# Patient Record
Sex: Female | Born: 1970 | Race: Black or African American | Hispanic: No | Marital: Single | Smoking: Never smoker
Health system: Southern US, Community
[De-identification: ages and names within clinical notes are randomized; demographics above are authoritative.]

## PROBLEM LIST (undated history)

## (undated) DIAGNOSIS — J45909 Unspecified asthma, uncomplicated: Secondary | ICD-10-CM

## (undated) DIAGNOSIS — T7840XA Allergy, unspecified, initial encounter: Secondary | ICD-10-CM

## (undated) DIAGNOSIS — I10 Essential (primary) hypertension: Secondary | ICD-10-CM

## (undated) HISTORY — PX: UMBILICAL HERNIA REPAIR: SHX196

## (undated) HISTORY — DX: Unspecified asthma, uncomplicated: J45.909

## (undated) HISTORY — PX: PARTIAL HYSTERECTOMY: SHX80

## (undated) HISTORY — DX: Allergy, unspecified, initial encounter: T78.40XA

---

## 2019-01-21 ENCOUNTER — Other Ambulatory Visit: Payer: Self-pay | Admitting: Family Medicine

## 2019-01-21 ENCOUNTER — Other Ambulatory Visit (HOSPITAL_COMMUNITY): Payer: Self-pay | Admitting: Family Medicine

## 2019-01-21 DIAGNOSIS — M5413 Radiculopathy, cervicothoracic region: Secondary | ICD-10-CM

## 2019-02-02 ENCOUNTER — Other Ambulatory Visit: Payer: Self-pay

## 2019-02-02 ENCOUNTER — Encounter: Payer: Self-pay | Admitting: Sports Medicine

## 2019-02-02 ENCOUNTER — Ambulatory Visit: Payer: Medicaid Other | Admitting: Sports Medicine

## 2019-02-02 DIAGNOSIS — L84 Corns and callosities: Secondary | ICD-10-CM

## 2019-02-02 DIAGNOSIS — B351 Tinea unguium: Secondary | ICD-10-CM | POA: Diagnosis not present

## 2019-02-02 DIAGNOSIS — M79672 Pain in left foot: Secondary | ICD-10-CM

## 2019-02-02 DIAGNOSIS — M79671 Pain in right foot: Secondary | ICD-10-CM

## 2019-02-02 NOTE — Progress Notes (Signed)
Subjective: Gina Mendez is a 49 y.o. female patient seen today in office with complaint of mildly painful thickened and discolored nails. Patient is desiring treatment for nail changes; has tried OTC topicals/Medication in the past with no improvement. Reports that nails are becoming difficult to manage because of the thickness.  Admits a history of going to the nail salon and then putting acrylic on her toenails and afterwards the nails thickening up and breaking off.  Patient also complains of hard callus skin to the bottoms of both feet in the past have been cut out with there was a big hole in the area skin grew back but it grew back callus with no improvement.  Patient has no other pedal complaints at this time.    Review of Systems  All other systems reviewed and are negative.   There are no problems to display for this patient.   No current outpatient medications on file prior to visit.   No current facility-administered medications on file prior to visit.    Allergies  Allergen Reactions  . Almond Oil Anaphylaxis  . No Known Allergies Anaphylaxis  . Other Anaphylaxis  . Oxycodone-Acetaminophen Shortness Of Breath  . Ceftriaxone     States she gets a yeast infection after taking.  . Promethazine     Objective: Physical Exam  General: Well developed, nourished, no acute distress, awake, alert and oriented x 3  Vascular: Dorsalis pedis artery 2/4 bilateral, Posterior tibial artery 2/4 bilateral, skin temperature warm to warm proximal to distal bilateral lower extremities, no varicosities, pedal hair present bilateral.  Neurological: Gross sensation present via light touch bilateral.   Dermatological: Skin is warm, dry, and supple bilateral, Nails 1-10 are tender, short thick, and discolored with mild subungal debris with bilateral first and second toes most involved, no webspace macerations present bilateral, no open lesions present bilateral, + callus/hyperkeratotic  tissue present bilateral fifth metatarsophalangeal joint and second metatarsophalangeal joints plantar aspect. No signs of infection bilateral.  Musculoskeletal: Fat pad atrophy and prominent metatarsal boney deformities noted bilateral. Muscular strength within normal limits without painon range of motion. No pain with calf compression bilateral.  Assessment and Plan:  Problem List Items Addressed This Visit    None    Visit Diagnoses    Nail fungus    -  Primary   Relevant Orders   Culture, fungus without smear   Callus       Foot pain, bilateral          -Examined patient -Discussed treatment options for painful dystrophic nails and callus -Fungal culture was obtained by removing a portion of the hard nail itself from each of the involved toenails using a sterile nail nipper and sent to [facility] lab. Patient tolerated the biopsy procedure well without discomfort or need for anesthesia.  -At no additional charge mechanically debrided callus x3 plantar surfaces of right foot using a sterile chisel blade without incident -Advised patient that she may benefit from custom orthotics or over-the-counter to help offload the calluses bilateral -Advised patient to use foot miracle cream as dispensed to callus areas sample dispensed -Patient to return in 4 weeks for follow up evaluation and discussion of fungal culture results or sooner if symptoms worsen.  Asencion Islam, DPM

## 2019-02-08 ENCOUNTER — Encounter (HOSPITAL_COMMUNITY): Payer: Self-pay

## 2019-02-08 ENCOUNTER — Ambulatory Visit (HOSPITAL_COMMUNITY): Payer: Medicaid Other

## 2019-03-02 ENCOUNTER — Other Ambulatory Visit: Payer: Self-pay | Admitting: Sports Medicine

## 2019-03-02 ENCOUNTER — Ambulatory Visit: Payer: Medicaid Other | Admitting: Sports Medicine

## 2019-03-02 ENCOUNTER — Encounter: Payer: Self-pay | Admitting: Sports Medicine

## 2019-03-02 ENCOUNTER — Ambulatory Visit (INDEPENDENT_AMBULATORY_CARE_PROVIDER_SITE_OTHER): Payer: Medicaid Other

## 2019-03-02 ENCOUNTER — Other Ambulatory Visit: Payer: Self-pay

## 2019-03-02 DIAGNOSIS — S96891A Other specified injury of other specified muscles and tendons at ankle and foot level, right foot, initial encounter: Secondary | ICD-10-CM

## 2019-03-02 DIAGNOSIS — M25571 Pain in right ankle and joints of right foot: Secondary | ICD-10-CM

## 2019-03-02 DIAGNOSIS — M79671 Pain in right foot: Secondary | ICD-10-CM

## 2019-03-02 DIAGNOSIS — S96811A Strain of other specified muscles and tendons at ankle and foot level, right foot, initial encounter: Secondary | ICD-10-CM | POA: Diagnosis not present

## 2019-03-02 DIAGNOSIS — S96911A Strain of unspecified muscle and tendon at ankle and foot level, right foot, initial encounter: Secondary | ICD-10-CM

## 2019-03-02 DIAGNOSIS — L603 Nail dystrophy: Secondary | ICD-10-CM

## 2019-03-02 MED ORDER — MELOXICAM 15 MG PO TABS: 15.0000 mg | ORAL_TABLET | Freq: Every day | ORAL | 0 refills | Status: DC

## 2019-03-02 NOTE — Progress Notes (Signed)
Subjective:  Gina Mendez is a 49 y.o. female patient who presents to office for evaluation of right ankle pain. Patient complains of continued pain in the ankle last 6 months. Patient has tried compression with no relief in symptoms.  Reports that she also went to a dermatologist who biopsied the dark skin lesion to the area and admitted that there was scar tissue etc. there was nothing more to do but the area continue to dark skin and there are more dark areas like this on the right ankle.  Patient denies any other pedal complaints. Denies injury/trip/fall/sprain/any causative factors.   Patient is also here for discussion of fungal culture results.    There are no problems to display for this patient.   No current outpatient medications on file prior to visit.   No current facility-administered medications on file prior to visit.    Allergies  Allergen Reactions  . Almond Oil Anaphylaxis  . No Known Allergies Anaphylaxis  . Other Anaphylaxis  . Oxycodone-Acetaminophen Shortness Of Breath  . Ceftriaxone     States she gets a yeast infection after taking.  . Promethazine     Objective:  General: Alert and oriented x3 in no acute distress  Dermatology: Nails are short and discolored especially bilateral first and second toenails with subungual debris and dark hyperpigmentation to the nails.  There is hyperpigmented flat lesions consistent with possible nevus versus scar at right ankle that are currently asymptomatic.  Vascular: Dorsalis Pedis and Posterior Tibial pedal pulses palpable, Capillary Fill Time 3 seconds,(+) pedal hair growth bilateral, no edema bilateral lower extremities, Temperature gradient within normal limits.  Neurology: Michaell Cowing sensation intact via light touch bilateral.  Musculoskeletal: Mild tenderness with palpation at right ankle at peroneal tendon course with some mild swelling. Negative talar tilt, Negative tib-fib stress, No instability. No pain with  calf compression bilateral. Range of motion within normal limits with mild guarding on right ankle. Strength within normal limits in all groups bilateral.   Gait: Antalgic gait  Xrays  Right foot/ankle   Impression: No acute osseous findings  Assessment and Plan: Problem List Items Addressed This Visit    None    Visit Diagnoses    Pain in right foot    -  Primary   Relevant Orders   DG Foot Complete Right   Right ankle pain, unspecified chronicity       Relevant Medications   meloxicam (MOBIC) 15 MG tablet   Nail dystrophy       Tear of tendon of right ankle, initial encounter           -Complete examination performed -Xrays reviewed -Discussed treatment options for tendinitis versus tear at right lateral ankle since pain has been going on greater than 6 months without improvement -Prescribed meloxicam -Ordered MRI for further evaluation of peroneal tendon rule out tear -Advised good supportive shoes and avoid activities that could aggravate the area -Continue with ankle support and avoid socks that rub or irritate ankle that could aggravate darkened/hyperpigmented areas -Rx Tolcylen gel for patient to use on nails and to wear shoes that are comfortable to prevent issues with microtrauma -Patient to return to office as scheduled or sooner if condition worsens.  Asencion Islam, DPM

## 2019-03-08 ENCOUNTER — Telehealth: Payer: Self-pay | Admitting: *Deleted

## 2019-03-08 DIAGNOSIS — S96911A Strain of unspecified muscle and tendon at ankle and foot level, right foot, initial encounter: Secondary | ICD-10-CM

## 2019-03-08 DIAGNOSIS — M25571 Pain in right ankle and joints of right foot: Secondary | ICD-10-CM

## 2019-03-08 NOTE — Telephone Encounter (Signed)
-----   Message from Asencion Islam, [place] sent at 03/02/2019  9:50 AM EST ----- Regarding: MRI R ankle Chronic pain and swelling >6 months at lateral ankle not improved with compression sleeve, rest, ice, elevation

## 2019-03-23 NOTE — Telephone Encounter (Signed)
[employer] - Medicaid requires clinicals for review prior to authorizing MRI RIGHT ANKLE 73721 without contrast, CASE: [id number]. Faxed clinicals to [employer].

## 2019-03-30 NOTE — Telephone Encounter (Addendum)
[employer] - MEDICAID AUTHORIZATION: [id number], FOR [id number] MRI RIGHT ANKLE, EFFECTIVE:  03/23/2019, END:  09/19/2019. Faxed to [facility] with orders.

## 2019-04-07 ENCOUNTER — Ambulatory Visit
Admission: RE | Admit: 2019-04-07 | Discharge: 2019-04-07 | Disposition: A | Discharge status: Home / Self Care | Payer: Medicaid Other | Source: Ambulatory Visit | Attending: Sports Medicine | Admitting: Sports Medicine

## 2019-04-07 ENCOUNTER — Other Ambulatory Visit: Payer: Self-pay

## 2019-04-07 DIAGNOSIS — M25571 Pain in right ankle and joints of right foot: Secondary | ICD-10-CM

## 2019-04-07 DIAGNOSIS — S96911A Strain of unspecified muscle and tendon at ankle and foot level, right foot, initial encounter: Secondary | ICD-10-CM

## 2019-04-13 ENCOUNTER — Telehealth: Payer: Self-pay | Admitting: Sports Medicine

## 2019-04-13 DIAGNOSIS — M25571 Pain in right ankle and joints of right foot: Secondary | ICD-10-CM

## 2019-04-13 NOTE — Telephone Encounter (Signed)
Patient said she received the results for her MRI, in My Chart and does not understand it. She was wondering if you could give her a call, to discuss it with her. She is scheduled to come in on 3/30, but still having pain, would like a update.

## 2019-04-13 NOTE — Telephone Encounter (Signed)
Spoke with patient Gina Mendez please order and arthritic panel Patient will stop by office to pick up requistion and go to lab to get blood work

## 2019-04-13 NOTE — Addendum Note (Signed)
Addended by: Alphia Kava D on: 04/13/2019 01:34 PM   Modules accepted: Orders

## 2019-04-15 ENCOUNTER — Telehealth: Payer: Self-pay | Admitting: Sports Medicine

## 2019-04-15 ENCOUNTER — Encounter: Payer: Self-pay | Admitting: Sports Medicine

## 2019-04-15 LAB — CBC WITH DIFFERENTIAL/PLATELET
Absolute Monocytes: 363 cells/uL (ref 200–950)
Basophils Absolute: 41 cells/uL (ref 0–200)
Basophils Relative: 1.1 %
Eosinophils Absolute: 122 cells/uL (ref 15–500)
Eosinophils Relative: 3.3 %
HCT: 36.8 % (ref 35.0–45.0)
Hemoglobin: 12.2 g/dL (ref 11.7–15.5)
Lymphs Abs: 2024 cells/uL (ref 850–3900)
MCH: 27.2 pg (ref 27.0–33.0)
MCHC: 33.2 g/dL (ref 32.0–36.0)
MCV: 82 fL (ref 80.0–100.0)
MPV: 8.7 fL (ref 7.5–12.5)
Monocytes Relative: 9.8 %
Neutro Abs: 1151 cells/uL — ABNORMAL LOW (ref 1500–7800)
Neutrophils Relative %: 31.1 %
Platelets: 401 10*3/uL — ABNORMAL HIGH (ref 140–400)
RBC: 4.49 10*6/uL (ref 3.80–5.10)
RDW: 12.2 % (ref 11.0–15.0)
Total Lymphocyte: 54.7 %
WBC: 3.7 10*3/uL — ABNORMAL LOW (ref 3.8–10.8)

## 2019-04-15 LAB — RHEUMATOID FACTOR: Rheumatoid fact SerPl-aCnc: <14 IU/mL (ref <14)

## 2019-04-15 LAB — C-REACTIVE PROTEIN: CRP: 3.7 mg/L (ref <8.0)

## 2019-04-15 LAB — ANA: Anti Nuclear Antibody (ANA): NEGATIVE

## 2019-04-15 LAB — URIC ACID: Uric Acid, Serum: 4.8 mg/dL (ref 2.5–7.0)

## 2019-04-15 LAB — SEDIMENTATION RATE: Sed Rate: 28 mm/h — ABNORMAL HIGH (ref 0–20)

## 2019-04-15 NOTE — Telephone Encounter (Signed)
Pt called wanted to know if Dr.Stover had read her blood results pt just wanted to understand her blood work.

## 2019-04-16 ENCOUNTER — Telehealth: Payer: Self-pay | Admitting: *Deleted

## 2019-04-16 MED ORDER — DICLOFENAC SODIUM 75 MG PO TBEC: 75.0000 mg | DELAYED_RELEASE_TABLET | Freq: Two times a day (BID) | ORAL | 1 refills | Status: DC

## 2019-04-16 NOTE — Telephone Encounter (Signed)
-----   Message from Gina Mendez, [place] sent at 04/15/2019  7:17 PM EDT ----- Please let patient know that her inflammation markers are elevated likely due to her tendonitis. There are no signs of any type of arthritis.If she is still having pain stop Mobic and we will send Diclofenac 75mg  bid to her pharmacy to try instead to see if this gives her better relief.

## 2019-04-16 NOTE — Telephone Encounter (Signed)
Pt informed in Results Notes.

## 2019-04-16 NOTE — Telephone Encounter (Signed)
I spoke with pt and informed of Dr. Wynema Birch review of results and orders. Pt states she is not getting pain relief and still has swelling, brace did not help either. I told pt Dr. Marylene Land had wanted her to stop the Mobic in that case, and we would send in Diclofenac and she would need to take with a snack at each dose.

## 2019-04-27 ENCOUNTER — Ambulatory Visit: Payer: Medicaid Other | Admitting: Sports Medicine

## 2019-04-27 ENCOUNTER — Other Ambulatory Visit: Payer: Self-pay

## 2019-04-27 ENCOUNTER — Encounter: Payer: Self-pay | Admitting: Sports Medicine

## 2019-04-27 VITALS — Temp 97.8°F

## 2019-04-27 DIAGNOSIS — M7751 Other enthesopathy of right foot: Secondary | ICD-10-CM | POA: Diagnosis not present

## 2019-04-27 DIAGNOSIS — M25571 Pain in right ankle and joints of right foot: Secondary | ICD-10-CM

## 2019-04-27 DIAGNOSIS — M79671 Pain in right foot: Secondary | ICD-10-CM

## 2019-04-27 DIAGNOSIS — M779 Enthesopathy, unspecified: Secondary | ICD-10-CM

## 2019-04-27 MED ORDER — PREDNISONE 10 MG (21) PO TBPK: ORAL_TABLET | ORAL | 0 refills | Status: DC

## 2019-04-27 MED ORDER — TRIAMCINOLONE ACETONIDE 10 MG/ML IJ SUSP
10.0000 mg | Freq: Once | INTRAMUSCULAR | Status: AC
  Administered 2019-04-27: 10 mg

## 2019-04-27 NOTE — Progress Notes (Signed)
Subjective:  Gina Mendez is a 49 y.o. female patient who returns to office for evaluation of right ankle pain. Patient complains of continued pain in the foot and ankle reports that her Tylenol and Motrin combination seems to help some she could not take Mobic because of GI problems but does report that she hears some popping especially in her knee when walking.  Patient is also here for review of MRI results.  Patient denies any other pedal complaints.  No other issues noted.    There are no problems to display for this patient.   Current Outpatient Medications on File Prior to Visit  Medication Sig Dispense Refill  . diclofenac (VOLTAREN) 75 MG EC tablet Take 1 tablet (75 mg total) by mouth 2 (two) times daily. 30 tablet 1  . meloxicam (MOBIC) 15 MG tablet Take 1 tablet (15 mg total) by mouth daily. (Patient not taking: Reported on 04/27/2019) 30 tablet 0   No current facility-administered medications on file prior to visit.    Allergies  Allergen Reactions  . Almond Oil Anaphylaxis  . No Known Allergies Anaphylaxis  . Other Anaphylaxis  . Oxycodone-Acetaminophen Shortness Of Breath  . Ceftriaxone     States she gets a yeast infection after taking.  . Promethazine     Objective:  General: Alert and oriented x3 in no acute distress  Dermatology: Nails are short and discolored especially bilateral first and second toenails with subungual debris and dark hyperpigmentation to the nails.  There is hyperpigmented flat lesions consistent with possible nevus versus scar at right ankle that are currently asymptomatic.  Vascular: Dorsalis Pedis and Posterior Tibial pedal pulses palpable, Capillary Fill Time 3 seconds,(+) pedal hair growth bilateral, no edema bilateral lower extremities, Temperature gradient within normal limits.  Neurology: Michaell Cowing sensation intact via light touch bilateral.  Musculoskeletal: Mild tenderness with palpation at right ankle at peroneal tendon course  with some mild swelling with also some pain and other tender areas along the sinus tarsi and mild pain at the plantar heel and Achilles tendon. Negative talar tilt, Negative tib-fib stress, No instability. No pain with calf compression bilateral. Range of motion within normal limits with mild guarding on right ankle. Strength within normal limits in all groups bilateral.   Gait: Antalgic gait  MRI IMPRESSION: 1. Mild posterior tibialis tenosynovitis. 2. Achilles insertional tendinosis with trace retrocalcaneal bursitis. 3. Minimal middle cord plantar fasciitis. 4. Edema within the sinus tarsi with a small amount of subtalar joint fluid. This could be due to sinus tarsi syndrome. Please correlate with the patient's site of pain. 5. Heel pad inflammation.  Assessment and Plan:   Problem List Items Addressed This Visit    None    Visit Diagnoses    Right ankle pain, unspecified chronicity    -  Primary   Pain in right foot       Tendonitis       Sinus tarsi syndrome of right foot           -Complete examination performed -Blood work consistent with inflammation nonspecific with mildly elevated sed rate -MRI results reviewed with patient -After oral consent and aseptic prep, injected a mixture containing 1 ml of 2%  plain lidocaine, 1 ml 0.5% plain marcaine, 0.5 ml of kenalog 10 and 0.5 ml of dexamethasone phosphate into right lateral foot and ankle along the peroneal tendon course as well as tried to disperse the medication to the lateral ankle at the level of the sinus tarsi without  complication. Post-injection care discussed with patient.  -Prescribed prednisone Dosepak for patient to take as instructed -May continue with Tylenol as needed for additional breakthrough pain -Advise rest ice elevation as needed -Advised patient to get cam boot off [place] -Patient to return to office in 3 to 4 weeks for follow-up evaluation of right foot and ankle pain as scheduled or sooner if  condition worsens.  Landis Martins, DPM

## 2019-05-18 ENCOUNTER — Ambulatory Visit: Payer: Medicaid Other | Admitting: Sports Medicine

## 2019-05-18 ENCOUNTER — Other Ambulatory Visit: Payer: Self-pay

## 2019-05-18 ENCOUNTER — Encounter: Payer: Self-pay | Admitting: Sports Medicine

## 2019-05-18 VITALS — Temp 97.6°F

## 2019-05-18 DIAGNOSIS — M779 Enthesopathy, unspecified: Secondary | ICD-10-CM

## 2019-05-18 DIAGNOSIS — M79671 Pain in right foot: Secondary | ICD-10-CM

## 2019-05-18 DIAGNOSIS — M25571 Pain in right ankle and joints of right foot: Secondary | ICD-10-CM

## 2019-05-18 DIAGNOSIS — M7751 Other enthesopathy of right foot: Secondary | ICD-10-CM | POA: Diagnosis not present

## 2019-05-18 MED ORDER — TRIAMCINOLONE ACETONIDE 10 MG/ML IJ SUSP: 10.0000 mg | Freq: Once | INTRAMUSCULAR | Status: DC

## 2019-05-18 NOTE — Progress Notes (Signed)
  Subjective: Omaya Nieland is a 49 y.o. female patient who returns to office for evaluation of right ankle pain. Patient reports that pain is still intense but not as bad as last time with pain all over but worse at the back of the heel at the achilles with some swelling still. Pain even with the boot. Reports that she completed her Medrol dose pack.  Patient denies any other pedal complaints.  No other issues noted.  There are no problems to display for this patient.   Current Outpatient Medications on File Prior to Visit  Medication Sig Dispense Refill  . diclofenac (VOLTAREN) 75 MG EC tablet Take 1 tablet (75 mg total) by mouth 2 (two) times daily. 30 tablet 1   No current facility-administered medications on file prior to visit.    Allergies  Allergen Reactions  . Almond Oil Anaphylaxis  . No Known Allergies Anaphylaxis  . Other Anaphylaxis  . Oxycodone-Acetaminophen Shortness Of Breath  . Ceftriaxone     States she gets a yeast infection after taking.  . Promethazine     Objective:  General: Alert and oriented x3 in no acute distress  Dermatology: Nails are short and discolored especially bilateral first and second toenails with subungual debris and dark hyperpigmentation to the nails.  There is hyperpigmented flat lesions consistent with possible nevus versus scar at right ankle that are currently asymptomatic.  Vascular: Dorsalis Pedis and Posterior Tibial pedal pulses palpable, Capillary Fill Time 3 seconds,(+) pedal hair growth bilateral, no edema bilateral lower extremities, Temperature gradient within normal limits.  Neurology: Michaell Cowing sensation intact via light touch bilateral.  Musculoskeletal: Mild tenderness with palpation at right ankle at peroneal tendon course with some mild swelling with also some pain and other tender areas along the sinus tarsi and mild pain at the plantar heel and Achilles tendon with the lateral achilles tendon area being worse at today's  visit. Range of motion within normal limits with mild guarding on right ankle. Strength within normal limits in all groups bilateral.   Gait: Antalgic CAM boot assisted gait  Assessment and Plan:   Problem List Items Addressed This Visit    None    Visit Diagnoses    Sinus tarsi syndrome of right foot    -  Primary   Tendonitis       Right ankle pain, unspecified chronicity       Pain in right foot       Bursitis of right ankle         -Complete examination performed -Re-discussed extensive inflammation on right foot and ankle -After oral consent and aseptic prep, injected a mixture containing 1 ml of 2%  plain lidocaine, 1 ml 0.5% plain marcaine, 0.5 ml of kenalog 10 and 0.5 ml of dexamethasone phosphate into right heel along the lateral achilles tendon course as with care. Post-injection care discussed with patient.  -Dispensed surgitube compression sleeve for right foot and ankle swelling -Continue with CAM boot after 1 week may try tennis shoe with heel lift if area feels better -May continue with Tylenol as needed -Continue with rest, ice, elevation  -Patient to return to office 2-3 weeks for follow-up evaluation of right foot and ankle pain as scheduled or sooner if condition worsens.  Asencion Islam, DPM

## 2019-05-28 ENCOUNTER — Other Ambulatory Visit: Payer: Self-pay

## 2019-05-28 ENCOUNTER — Ambulatory Visit (HOSPITAL_COMMUNITY)
Admission: EM | Admit: 2019-05-28 | Discharge: 2019-05-28 | Disposition: A | Discharge status: Home / Self Care | Payer: Medicaid Other | Attending: Family Medicine | Admitting: Family Medicine

## 2019-05-28 ENCOUNTER — Ambulatory Visit (INDEPENDENT_AMBULATORY_CARE_PROVIDER_SITE_OTHER): Payer: Medicaid Other

## 2019-05-28 ENCOUNTER — Encounter (HOSPITAL_COMMUNITY): Payer: Self-pay

## 2019-05-28 DIAGNOSIS — M79601 Pain in right arm: Secondary | ICD-10-CM | POA: Diagnosis not present

## 2019-05-28 DIAGNOSIS — R002 Palpitations: Secondary | ICD-10-CM

## 2019-05-28 DIAGNOSIS — R0789 Other chest pain: Secondary | ICD-10-CM

## 2019-05-28 MED ORDER — DICLOFENAC SODIUM 50 MG PO TBEC: 50.0000 mg | DELAYED_RELEASE_TABLET | Freq: Two times a day (BID) | ORAL | 0 refills | Status: DC

## 2019-05-28 NOTE — Discharge Instructions (Addendum)
EKG normal- Please follow up with cardiology for further evaluation of symptoms Try diclofenac twice daily for arm/chest and ankle pain- take with food twice daily Follow up with cardiology to have updated stress test, possible echo or monitoring of your heart fluttering sensation Reestablish care with primary care- contact below

## 2019-05-28 NOTE — ED Provider Notes (Signed)
[facility]    CSN: [id number] Arrival date & time: 05/28/19  1002      History   Chief Complaint Chief Complaint  Patient presents with  . Chest Pain  . Arm Pain  . Palpitations    HPI Gina Mendez is a 49 y.o. female presenting today for evaluation of multiple complaints including right arm pain, chest pain, palpitations.  Patient notes that over the past year she has had intermittent right arm pain and swelling that extends from her right neck/back area and goes into her hand.  She denies any injury.  Does note that she is right-handed and works for a [employer] and is often using this hand for work.  She also notes that she has a remote injury of sustaining glass within her arm which is still present.  This was not removed as concern for proximity to an artery.  She is concerned of this may be contributing to her recurrent pain.  Feels pain is interfering with her day-to-day job.  Of recently she has also developed more discomfort in her chest.  This is central.  Feels a dull aching sensation.  She also reports sensations of palpitations/heart racing and heart fluttering especially at nighttime.  These episodes are scaring her and have been affecting her sleep.  States symptoms resolved by morning time.  She is concerned as her sister has had a history of CHF.  She also reports having prior echo of heart showing decreased ejection fraction, but this was at a different hospital.  She recently moved here.  She also reports increased shortness of breath is along with needing to sleep on pillows with sleep.  She also reports chronic issues with her right ankle/foot and is seeing child foot and ankle for this.  She recently was on a course of prednisone which did significantly help her symptoms in her foot as well as her arm, but symptoms have returned since being off the prednisone.  She was trying Mobic without relief of symptoms.  HPI  History reviewed. No pertinent  past medical history.  There are no problems to display for this patient.   History reviewed. No pertinent surgical history.  OB History   No obstetric history on file.      Home Medications    Prior to Admission medications   Medication Sig Start Date End Date Taking? Authorizing Provider  diclofenac (VOLTAREN) 50 MG EC tablet Take 1 tablet (50 mg total) by mouth 2 (two) times daily. 05/28/19   Leonel Mccollum, Junius Creamer, PA-C    Family History History reviewed. No pertinent family history.  Social History Social History   Tobacco Use  . Smoking status: Never Smoker  . Smokeless tobacco: Never Used  Substance Use Topics  . Alcohol use: Not on file  . Drug use: Not on file     Allergies   Almond oil, Oxycodone-acetaminophen, and Promethazine   Review of Systems Review of Systems  Constitutional: Negative for fatigue and fever.  HENT: Negative for congestion, sinus pressure and sore throat.   Eyes: Negative for photophobia, pain and visual disturbance.  Respiratory: Negative for cough and shortness of breath.   Cardiovascular: Positive for chest pain and palpitations. Negative for leg swelling.  Gastrointestinal: Negative for abdominal pain, nausea and vomiting.  Genitourinary: Negative for decreased urine volume and hematuria.  Musculoskeletal: Positive for arthralgias, back pain, myalgias, neck pain and neck stiffness.  Neurological: Positive for headaches. Negative for dizziness, syncope, facial asymmetry, speech difficulty,  weakness, light-headedness and numbness.     Physical Exam Triage Vital Signs ED Triage Vitals  Enc Vitals Group     BP 05/28/19 1035 140/84     Pulse Rate 05/28/19 1035 88     Resp 05/28/19 1035 20     Temp 05/28/19 1035 99.2 F (37.3 C)     Temp Source 05/28/19 1035 Oral     SpO2 05/28/19 1035 98 %     Weight --      Height --      Head Circumference --      Peak Flow --      Pain Score 05/28/19 1031 7     Pain Loc --      Pain Edu?  --      Excl. in [place]? --    No data found.  Updated Vital Signs BP 140/84 (BP Location: Left Arm)   Pulse 88   Temp 99.2 F (37.3 C) (Oral)   Resp 20   SpO2 98%   Visual Acuity Right Eye Distance:   Left Eye Distance:   Bilateral Distance:    Right Eye Near:   Left Eye Near:    Bilateral Near:     Physical Exam Vitals and nursing note reviewed.  Constitutional:      General: She is not in acute distress.    Appearance: She is well-developed.  HENT:     Head: Normocephalic and atraumatic.     Mouth/Throat:     Comments: Oral mucosa pink and moist, no tonsillar enlargement or exudate. Posterior pharynx patent and nonerythematous, no uvula deviation or swelling. Normal phonation. Eyes:     Extraocular Movements: Extraocular movements intact.     Conjunctiva/sclera: Conjunctivae normal.     Pupils: Pupils are equal, round, and reactive to light.  Cardiovascular:     Rate and Rhythm: Normal rate and regular rhythm.     Heart sounds: No murmur.     Comments: No carotid bruits auscultated Pulmonary:     Effort: Pulmonary effort is normal. No respiratory distress.     Breath sounds: Normal breath sounds.     Comments: Breathing comfortably at rest, CTABL, no wheezing, rales or other adventitious sounds auscultated  Left parasternal border with mildly reproducible tenderness to palpation Abdominal:     Palpations: Abdomen is soft.     Tenderness: There is no abdominal tenderness.  Musculoskeletal:     Cervical back: Neck supple.     Comments: Back: Diffuse tenderness throughout entire cervical, thoracic and lumbar spine midline, diffuse tenderness throughout right cervical and upper thoracic musculature,    Right shoulder: Nontender to palpation along clavicle, AC joint or scapular spine; full active range of motion, strength at shoulder 5/5 and equal bilaterally  Right elbow: Full active range of motion at elbow, nontender to palpation over olecranon and medial and  lateral epicondyles; just distal to the elbow palpable hard foreign body within soft tissue of the proximal forearm  Right hand: No obvious swelling or deformity, radial pulse 2+, full active range of motion of all fingers and wrist, nontender to palpation throughout distal radius and ulna extending into carpals and metacarpals  Skin:    General: Skin is warm and dry.  Neurological:     Mental Status: She is alert.      UC Treatments / Results  Labs (all labs ordered are listed, but only abnormal results are displayed) Labs Reviewed - No data to display  EKG   Radiology DG  Chest 2 View  Result Date: 05/28/2019 CLINICAL DATA:  Chest pain and shortness of breath for 1 year. EXAM: CHEST - 2 VIEW COMPARISON:  None. FINDINGS: The cardiomediastinal silhouette is unremarkable. There is no evidence of focal airspace disease, pulmonary edema, suspicious pulmonary nodule/mass, pleural effusion, or pneumothorax. No acute bony abnormalities are identified. IMPRESSION: No active cardiopulmonary disease. Electronically Signed   By: Harmon Pier M.D.   On: 05/28/2019 11:56    Procedures Procedures (including critical care time)  Medications Ordered in UC Medications - No data to display  Initial Impression / Assessment and Plan / UC Course  I have reviewed the triage vital signs and the nursing notes.  Pertinent labs & imaging results that were available during my care of the patient were reviewed by me and considered in my medical decision making (see chart for details).  Clinical Course as of May 28 1514  Fri May 28, 2019  1231 DG Chest 2 View [YN]    Clinical Course User Index [YN] Eustace Moore, MD    Chest x-ray without acute abnormality, EKG normal sinus rhythm, no acute signs of ischemia or infarction.  Possible MSK cause of chest discomfort.  Also suspect overuse injury contributing to right arm/back pain given nature of job.  Recommending continuing anti-inflammatories  muscle relaxers.  Will provide diclofenac as alternative.  Did discuss possibility of retained foreign body contributing to recurrent nature of her arm pain/swelling.  Advised to follow-up with orthopedics for further discussion of relation/possibility of removal.  Patient new to area, discussed establishing care with PCP for preventative care as well as further evaluation of palpitations with cardiology.  Provided contacts.  Discussed strict return precautions. Patient verbalized understanding and is agreeable with plan.  Final Clinical Impressions(s) / UC Diagnoses   Final diagnoses:  Atypical chest pain  Palpitations  Right arm pain     Discharge Instructions     EKG normal- Please follow up with cardiology for further evaluation of symptoms Try diclofenac twice daily for arm/chest and ankle pain- take with food twice daily Follow up with cardiology to have updated stress test, possible echo or monitoring of your heart fluttering sensation Reestablish care with primary care- contact below   ED Prescriptions    Medication Sig Dispense Auth. Provider   diclofenac (VOLTAREN) 50 MG EC tablet Take 1 tablet (50 mg total) by mouth 2 (two) times daily. 30 tablet Nedim Oki, Golf C, PA-C     PDMP not reviewed this encounter.   Lew Dawes, [place] 05/28/19 1523

## 2019-05-28 NOTE — ED Triage Notes (Addendum)
Pt reports having chest pain and right arm pain and swelling x 1 year. Pt states the chest pain "feels like heartburn". Pt reports having palpitations when she go to sleep x 1 year. Pt states the palpitations wakes her up at night. Pt reports she was seen for this chief complainty by her PCP and everything was good.

## 2019-06-03 ENCOUNTER — Other Ambulatory Visit: Payer: Self-pay | Admitting: Orthopedic Surgery

## 2019-06-03 DIAGNOSIS — M79631 Pain in right forearm: Secondary | ICD-10-CM

## 2019-06-15 ENCOUNTER — Encounter: Payer: Self-pay | Admitting: Sports Medicine

## 2019-06-15 ENCOUNTER — Other Ambulatory Visit: Payer: Self-pay

## 2019-06-15 ENCOUNTER — Ambulatory Visit (INDEPENDENT_AMBULATORY_CARE_PROVIDER_SITE_OTHER): Payer: Medicaid Other | Admitting: Sports Medicine

## 2019-06-15 VITALS — Temp 98.0°F

## 2019-06-15 DIAGNOSIS — M7751 Other enthesopathy of right foot: Secondary | ICD-10-CM

## 2019-06-15 DIAGNOSIS — M779 Enthesopathy, unspecified: Secondary | ICD-10-CM

## 2019-06-15 DIAGNOSIS — M25571 Pain in right ankle and joints of right foot: Secondary | ICD-10-CM

## 2019-06-15 DIAGNOSIS — M79671 Pain in right foot: Secondary | ICD-10-CM

## 2019-06-15 DIAGNOSIS — M25471 Effusion, right ankle: Secondary | ICD-10-CM

## 2019-06-15 NOTE — Progress Notes (Signed)
  Subjective: Gina Mendez is a 49 y.o. female patient who returns to office for follow up evaluation of right ankle/foot pain. Patient reports that pain is doing much better, have swelling still at lateral ankle but elevation has helped. Reports that she feels a little pain with steps and is wondering if she needs to go back to her CAM boot. Patient reports that she has been having back issues and recieved a steroid shot there too and think that it has helped her foot as well.  Patient denies any other pedal complaints. No other issues noted.  There are no problems to display for this patient.   Current Outpatient Medications on File Prior to Visit  Medication Sig Dispense Refill  . diclofenac (VOLTAREN) 50 MG EC tablet Take 1 tablet (50 mg total) by mouth 2 (two) times daily. 30 tablet 0  . Olopatadine HCl 0.2 % SOLN Apply 1 drop to eye every morning.     Current Facility-Administered Medications on File Prior to Visit  Medication Dose Route Frequency Provider Last Rate Last Admin  . triamcinolone acetonide (KENALOG) 10 MG/ML injection 10 mg  10 mg Other Once Asencion Islam, DPM        Allergies  Allergen Reactions  . Almond Oil Anaphylaxis  . Oxycodone-Acetaminophen Shortness Of Breath  . Promethazine     Objective:  General: Alert and oriented x3 in no acute distress  Dermatology: Nails are short and discolored especially bilateral first and second toenails with subungual debris and dark hyperpigmentation to the nails.  There is hyperpigmented flat lesions consistent with possible nevus versus scar at right ankle that are currently asymptomatic.  Vascular: Dorsalis Pedis and Posterior Tibial pedal pulses palpable, Capillary Fill Time 3 seconds,(+) pedal hair growth bilateral, no edema bilateral lower extremities, Temperature gradient within normal limits.  Neurology: Michaell Cowing sensation intact via light touch bilateral.  Musculoskeletal: Minimal tenderness with palpation at  right ankle at peroneal tendon course with some mild swelling, no pain today at sinus tarsi and at the plantar heel. No pain to achilles on right. Range of motion within normal limits with mild guarding on right ankle. Strength within normal limits in all groups bilateral.   Assessment and Plan:   Problem List Items Addressed This Visit    None    Visit Diagnoses    Sinus tarsi syndrome of right foot    -  Primary   Tendonitis       Right ankle pain, unspecified chronicity       Pain in right foot       Swelling of ankle, right         -Complete examination performed -Re-discussed extensive inflammation on right foot and ankle that is improving with still some swelling but decreased pain -Dispensed trilock at no additional charge and advised patient to use this with good supportive tennis shoe instead of CAM boot -May continue with Tylenol as needed -Continue with rest, ice, elevation  -Continue with follow up with back doctor as well  -Patient to return to office if no better after 1 month or sooner if condition worsens.  Asencion Islam, DPM

## 2019-06-20 ENCOUNTER — Ambulatory Visit
Admission: RE | Admit: 2019-06-20 | Discharge: 2019-06-20 | Disposition: A | Discharge status: Home / Self Care | Payer: Medicaid Other | Source: Ambulatory Visit | Attending: Orthopedic Surgery | Admitting: Orthopedic Surgery

## 2019-06-20 DIAGNOSIS — M79631 Pain in right forearm: Secondary | ICD-10-CM

## 2019-07-13 ENCOUNTER — Other Ambulatory Visit: Payer: Self-pay

## 2019-07-13 ENCOUNTER — Ambulatory Visit: Payer: Medicaid Other | Admitting: Sports Medicine

## 2019-07-13 ENCOUNTER — Encounter: Payer: Self-pay | Admitting: Sports Medicine

## 2019-07-13 VITALS — Temp 96.5°F

## 2019-07-13 DIAGNOSIS — M25571 Pain in right ankle and joints of right foot: Secondary | ICD-10-CM

## 2019-07-13 DIAGNOSIS — M25471 Effusion, right ankle: Secondary | ICD-10-CM

## 2019-07-13 DIAGNOSIS — M79671 Pain in right foot: Secondary | ICD-10-CM

## 2019-07-13 DIAGNOSIS — M7751 Other enthesopathy of right foot: Secondary | ICD-10-CM

## 2019-07-13 DIAGNOSIS — M779 Enthesopathy, unspecified: Secondary | ICD-10-CM

## 2019-07-13 MED ORDER — NABUMETONE 500 MG PO TABS
500.0000 mg | ORAL_TABLET | Freq: Two times a day (BID) | ORAL | 1 refills | Status: DC
Start: 2019-07-13 — End: 2019-09-14

## 2019-07-13 MED ORDER — TRIAMCINOLONE ACETONIDE 10 MG/ML IJ SUSP: 10.0000 mg | Freq: Once | INTRAMUSCULAR | Status: DC

## 2019-07-13 NOTE — Progress Notes (Signed)
Subjective: Gina Mendez is a 49 y.o. female patient who returns to office for follow up evaluation of right ankle/foot pain. Patient reports that pain has gotten bad again on her ankle but this time instead of hurting on the lateral side is hurting on the medial side of the foot and ankle reports that when she went for her injection and her right knee it seemed to make her foot pain worse reports that she has been wearing the brace because she cannot walk without it and has been alternating between Tylenol and ibuprofen without any additional relief reports that her back is doing better. No other issues noted.  There are no problems to display for this patient.   Current Outpatient Medications on File Prior to Visit  Medication Sig Dispense Refill  . Olopatadine HCl 0.2 % SOLN Apply 1 drop to eye every morning.    . diclofenac (VOLTAREN) 50 MG EC tablet Take 1 tablet (50 mg total) by mouth 2 (two) times daily. (Patient not taking: Reported on 07/13/2019) 30 tablet 0   Current Facility-Administered Medications on File Prior to Visit  Medication Dose Route Frequency Provider Last Rate Last Admin  . triamcinolone acetonide (KENALOG) 10 MG/ML injection 10 mg  10 mg Other Once Landis Martins, DPM        Allergies  Allergen Reactions  . Almond Oil Anaphylaxis  . Oxycodone-Acetaminophen Shortness Of Breath  . Promethazine     Objective:  General: Alert and oriented x3 in no acute distress  Dermatology: Nails are short and discolored especially bilateral first and second toenails with subungual debris and dark hyperpigmentation to the nails.  There is hyperpigmented flat lesions consistent with possible nevus versus scar at right ankle that are currently asymptomatic.  No acute changes.  Vascular: Dorsalis Pedis and Posterior Tibial pedal pulses palpable, Capillary Fill Time 3 seconds,(+) pedal hair growth bilateral, no edema bilateral lower extremities, Temperature gradient within normal  limits.  Neurology: Johney Maine sensation intact via light touch bilateral.  Musculoskeletal: Moderate tenderness over the posterior tibial tendon course at medial ankle to navicular tuberosity on right, minimal tenderness with palpation at right ankle at peroneal tendon course with some mild swelling, no pain today at sinus tarsi and at the plantar heel. No pain to achilles on right. Range of motion within normal limits with mild guarding on right ankle. Strength within normal limits in all groups bilateral.   Assessment and Plan:   Problem List Items Addressed This Visit    None    Visit Diagnoses    Tendonitis    -  Primary   Right ankle pain, unspecified chronicity       Sinus tarsi syndrome of right foot       Pain in right foot       Swelling of ankle, right       Bursitis of right ankle         -Complete examination performed -Re-discussed inflammation and tendinitis -After oral consent and aseptic prep, injected a mixture containing 1 ml of 2%  plain lidocaine, 1 ml 0.5% plain marcaine, 0.5 ml of kenalog 10 and 0.5 ml of dexamethasone phosphate into right medial foot and ankle along the posterior tibial tendon course without complication. Post-injection care discussed with patient.  -Dispensed compression sleeve and advised patient to continue with her Tri-Lock brace that she has if she is doing with any flareup of pain advised patient to go back to using her cam boot temporarily as needed -Prescribed Relafen  for pain inflammation to see if this will help as well sent patient has failed other anti-inflammatories by mouth previously -Continue with rest, ice, elevation  -Continue with follow up with back and knee doctor as well  -Patient to call office if no better after 2 weeks for order for physical therapy  Asencion Islam, DPM

## 2019-07-21 ENCOUNTER — Other Ambulatory Visit: Payer: Self-pay | Admitting: Orthopedic Surgery

## 2019-07-21 DIAGNOSIS — M25561 Pain in right knee: Secondary | ICD-10-CM

## 2019-08-25 ENCOUNTER — Other Ambulatory Visit: Payer: Medicaid Other

## 2019-08-25 ENCOUNTER — Ambulatory Visit
Admission: RE | Admit: 2019-08-25 | Discharge: 2019-08-25 | Disposition: A | Discharge status: Home / Self Care | Payer: Medicaid Other | Source: Ambulatory Visit | Attending: Orthopedic Surgery | Admitting: Orthopedic Surgery

## 2019-08-25 DIAGNOSIS — M25561 Pain in right knee: Secondary | ICD-10-CM

## 2019-09-14 ENCOUNTER — Encounter: Payer: Self-pay | Admitting: Podiatry

## 2019-09-14 ENCOUNTER — Other Ambulatory Visit: Payer: Self-pay

## 2019-09-14 ENCOUNTER — Ambulatory Visit: Payer: Medicaid Other | Admitting: Podiatry

## 2019-09-14 VITALS — Temp 97.3°F

## 2019-09-14 DIAGNOSIS — M5417 Radiculopathy, lumbosacral region: Secondary | ICD-10-CM | POA: Diagnosis not present

## 2019-09-14 DIAGNOSIS — M5431 Sciatica, right side: Secondary | ICD-10-CM

## 2019-09-14 DIAGNOSIS — G8929 Other chronic pain: Secondary | ICD-10-CM

## 2019-09-14 DIAGNOSIS — M79671 Pain in right foot: Secondary | ICD-10-CM

## 2019-09-14 DIAGNOSIS — M25571 Pain in right ankle and joints of right foot: Secondary | ICD-10-CM

## 2019-09-15 NOTE — Progress Notes (Signed)
Subjective:  Patient ID: Gina Mendez, female    DOB: 16-Dec-1970,  MRN: [id number]  Chief Complaint  Patient presents with  . Ankle/foot Pain    Follow-up; Right Ankle-medial side; back of heel-swelling; bottom of heel; pt stated, "My foot and ankle still hurt; entire foot still hurts"  . MRI    Had MRI on 04/07/19    49 y.o. female presents with the above complaint. History confirmed with patient.  She complains of chronic and severe right foot and ankle pain.  She notices intermittent swelling and burning shooting pains in the right lower extremity.  Sometimes she feels like these pain starts in the inside of the ankle and shoots down into the toes or sometimes of the leg.  She had an MRI in March and is here for review.  She is taken diclofenac and has not been helpful.  She has a history of low back pain and occasionally feels sharp pains that shoot down the backside of her hip and buttocks.  She says she has had a history of painful and itchy skin lesions which have developed on the lower legs.  These were biopsied by dermatology, and they told her that it was a benign skin lesion.  Objective:  Physical Exam: warm, good capillary refill, no trophic changes or ulcerative lesions, normal DP and PT pulses and normal sensory exam.  She has pain on palpation to the anterolateral ankle, the tarsal tunnel and plantar foot.  She has a mild positive Tinel's in the tarsal tunnel as well is pain with palpation to the superficial peroneal nerve and sural nerve.  No pain along the posterior tibial tendon, Achilles tendon or plantar fascia.  She has 5 out of 5 muscle strength.  Healed scars from skin lesions on the lower leg   Study Result  Narrative & Impression  CLINICAL DATA:  Ankle pain and swelling for 1 year on the lateral side  EXAM: MRI OF THE RIGHT ANKLE WITHOUT CONTRAST  TECHNIQUE: Multiplanar, multisequence MR imaging of the ankle was performed. No intravenous contrast was  administered.  COMPARISON:  Outside radiograph March 02, 2019  FINDINGS: TENDONS  Peroneal: Peroneal longus tendon intact. Peroneal brevis intact.  Posteromedial: There is a small amount of fluid seen surrounding the posterior tibialis tendon, however it is intact. Flexor hallucis longus tendon intact. Flexor digitorum longus tendon intact.  Anterior: Tibialis anterior tendon intact. Extensor hallucis longus tendon intact Extensor digitorum longus tendon intact.  Achilles: Intact. There is mildly increased signal seen within the 2 cm segment of Achilles tendon at the insertion site. A tiny amount of retrocalcaneal bursal fluid is seen.  Plantar Fascia: Intact. Minimal increased signa her l seen at the insertion site of the middle cord of the plantar fascia.  LIGAMENTS  Lateral: Anterior talofibular ligament intact. Calcaneofibular ligament intact. Posterior talofibular ligament intact. Anterior and posterior tibiofibular ligaments intact.  Medial: Deltoid ligament intact. Spring ligament intact.  CARTILAGE  Ankle Joint: A trace ankle joint effusion is seen. Normal ankle mortise. No chondral defect.  Subtalar Joints/Sinus Tarsi: Normal subtalar joints. A small amount of subtalar joint fluid is seen. There is increased signal seen within the sinus tarsi. Bones: No marrow signal abnormality. No fracture, marrow edema, or pathologic marrow infiltration.  Soft Tissue: Mild heel pad inflammation is seen.  IMPRESSION: 1. Mild posterior tibialis tenosynovitis. 2. Achilles insertional tendinosis with trace retrocalcaneal bursitis. 3. Minimal middle cord plantar fasciitis. 4. Edema within the sinus tarsi with a small amount of  subtalar joint fluid. This could be due to sinus tarsi syndrome. Please correlate with the patient's site of pain. 5. Heel pad inflammation.    Assessment:   1. Lumbosacral radiculopathy   2. Sciatica of right side   3. Chronic  pain in right foot   4. Chronic pain of right ankle      Plan:  Patient was evaluated and treated and all questions answered.  -Given her history of the skin lesions and  previous biopsies we did not have the results of today as well as her complex history, I think it be worthwhile to evaluate for any potential history of Lyme disease or psoriatic or rheumatoid arthritis.  Lab work was ordered for this.  -I do think most of her symptoms are neuritic in nature and appear to be related to multiple nerve roots.  She does have symptoms of low back pain and sciatica on this side.  I would like to order EMG and NCV and refer her to neurology for evaluation of lumbosacral radiculopathy.  -Reviewed her MRI and x-rays with her, currently there is no objective finding for me to treat within the foot or ankle.  She has some symptoms of tarsal tunnel syndrome but I believe that this is due to a higher process from the spine and not necessarily a lesion within the tarsal tunnel itself, as well as the fact that she has neuritic symptoms of other nerves in the lower extremity.   Return in about 6 weeks (around 10/26/2019).

## 2019-09-17 LAB — CBC WITH DIFFERENTIAL/PLATELET
Absolute Monocytes: 320 cells/uL (ref 200–950)
Basophils Absolute: 40 cells/uL (ref 0–200)
Basophils Relative: 1.1 %
Eosinophils Absolute: 169 cells/uL (ref 15–500)
Eosinophils Relative: 4.7 %
HCT: 37.4 % (ref 35.0–45.0)
Hemoglobin: 12.5 g/dL (ref 11.7–15.5)
Lymphs Abs: 2009 cells/uL (ref 850–3900)
MCH: 28.1 pg (ref 27.0–33.0)
MCHC: 33.4 g/dL (ref 32.0–36.0)
MCV: 84 fL (ref 80.0–100.0)
MPV: 8.6 fL (ref 7.5–12.5)
Monocytes Relative: 8.9 %
Neutro Abs: 1062 cells/uL — ABNORMAL LOW (ref 1500–7800)
Neutrophils Relative %: 29.5 %
Platelets: 410 10*3/uL — ABNORMAL HIGH (ref 140–400)
RBC: 4.45 10*6/uL (ref 3.80–5.10)
RDW: 12.6 % (ref 11.0–15.0)
Total Lymphocyte: 55.8 %
WBC: 3.6 10*3/uL — ABNORMAL LOW (ref 3.8–10.8)

## 2019-09-17 LAB — ANA,IFA RA DIAG PNL W/RFLX TIT/PATN
Anti Nuclear Antibody (ANA): NEGATIVE
Cyclic Citrullin Peptide Ab: <16 UNITS
Rheumatoid fact SerPl-aCnc: <14 IU/mL (ref <14)

## 2019-09-17 LAB — B. BURGDORFI ANTIBODIES BY WB
B burgdorferi IgG Abs (IB): NEGATIVE
B burgdorferi IgM Abs (IB): NEGATIVE
Lyme Disease 18 kD IgG: NONREACTIVE
Lyme Disease 23 kD IgG: NONREACTIVE
Lyme Disease 23 kD IgM: NONREACTIVE
Lyme Disease 28 kD IgG: NONREACTIVE
Lyme Disease 30 kD IgG: NONREACTIVE
Lyme Disease 39 kD IgG: NONREACTIVE
Lyme Disease 39 kD IgM: REACTIVE — AB
Lyme Disease 41 kD IgG: NONREACTIVE
Lyme Disease 41 kD IgM: NONREACTIVE
Lyme Disease 45 kD IgG: NONREACTIVE
Lyme Disease 58 kD IgG: NONREACTIVE
Lyme Disease 66 kD IgG: NONREACTIVE
Lyme Disease 93 kD IgG: NONREACTIVE

## 2019-09-17 LAB — HLA-B27 ANTIGEN: HLA-B27 Antigen: NEGATIVE

## 2019-09-17 LAB — SEDIMENTATION RATE: Sed Rate: 9 mm/h (ref 0–20)

## 2019-09-21 ENCOUNTER — Other Ambulatory Visit: Payer: Self-pay | Admitting: Orthopedic Surgery

## 2019-09-21 DIAGNOSIS — M545 Low back pain, unspecified: Secondary | ICD-10-CM

## 2019-09-23 ENCOUNTER — Encounter (HOSPITAL_COMMUNITY): Payer: Self-pay

## 2019-09-23 ENCOUNTER — Other Ambulatory Visit: Payer: Self-pay

## 2019-09-23 ENCOUNTER — Ambulatory Visit (HOSPITAL_COMMUNITY)
Admission: EM | Admit: 2019-09-23 | Discharge: 2019-09-23 | Disposition: A | Discharge status: Home / Self Care | Payer: Medicaid Other | Attending: Family Medicine | Admitting: Family Medicine

## 2019-09-23 ENCOUNTER — Ambulatory Visit (INDEPENDENT_AMBULATORY_CARE_PROVIDER_SITE_OTHER): Payer: Medicaid Other

## 2019-09-23 DIAGNOSIS — R600 Localized edema: Secondary | ICD-10-CM | POA: Insufficient documentation

## 2019-09-23 DIAGNOSIS — R0789 Other chest pain: Secondary | ICD-10-CM

## 2019-09-23 LAB — COMPREHENSIVE METABOLIC PANEL
ALT: 30 U/L (ref 0–44)
AST: 34 U/L (ref 15–41)
Albumin: 3.7 g/dL (ref 3.5–5.0)
Alkaline Phosphatase: 69 U/L (ref 38–126)
Anion gap: 9 (ref 5–15)
BUN: 8 mg/dL (ref 6–20)
CO2: 25 mmol/L (ref 22–32)
Calcium: 9.5 mg/dL (ref 8.9–10.3)
Chloride: 104 mmol/L (ref 98–111)
Creatinine, Ser: 0.81 mg/dL (ref 0.44–1.00)
GFR calc Af Amer: >60 mL/min (ref >60)
GFR calc non Af Amer: >60 mL/min (ref >60)
Glucose, Bld: 91 mg/dL (ref 70–99)
Potassium: 4 mmol/L (ref 3.5–5.1)
Sodium: 138 mmol/L (ref 135–145)
Total Bilirubin: 0.9 mg/dL (ref 0.3–1.2)
Total Protein: 6.9 g/dL (ref 6.5–8.1)

## 2019-09-23 LAB — BRAIN NATRIURETIC PEPTIDE: B Natriuretic Peptide: 19.5 pg/mL (ref 0.0–100.0)

## 2019-09-23 LAB — CBC
HCT: 39.1 % (ref 36.0–46.0)
Hemoglobin: 13 g/dL (ref 12.0–15.0)
MCH: 28.4 pg (ref 26.0–34.0)
MCHC: 33.2 g/dL (ref 30.0–36.0)
MCV: 85.4 fL (ref 80.0–100.0)
Platelets: 383 10*3/uL (ref 150–400)
RBC: 4.58 MIL/uL (ref 3.87–5.11)
RDW: 12.5 % (ref 11.5–15.5)
WBC: 6.1 10*3/uL (ref 4.0–10.5)
nRBC: 0 % (ref 0.0–0.2)

## 2019-09-23 MED ORDER — FUROSEMIDE 20 MG PO TABS
20.0000 mg | ORAL_TABLET | Freq: Every day | ORAL | 0 refills | Status: DC
Start: 2019-09-23 — End: 2019-09-30

## 2019-09-23 NOTE — ED Provider Notes (Signed)
[facility]    CSN: [id number] Arrival date & time: 09/23/19  1818      History   Chief Complaint Chief Complaint  Patient presents with  . Chest Pain  . Headache  . Leg Swelling    HPI Gina Mendez is a 49 y.o. female.   HPI   Patient was seen here in April for similar symptoms.  That note is reviewed.  Her chest x-ray and EKG from that visit are also reviewed.  Patient is here with similar symptoms.  She states that she has chest pressure with a fluttering feeling, and headache that comes and goes.  She has swelling in her feet and ankles for 2 days.  She states that at night when she tries to lie down she feels like she cannot breathe and like she is choking/drowning and has to sit up in bed. No history of hypertension No history of heart disease No history of diabetes Has no history of hyperlipidemia No history of smoking She was told that she needs to cardiology.  She states that she called them and they were supposed to call her back, but never did She denies any recent infection, cough or cold, headache or body aches, fever or chills She is here today because the swelling is gotten big enough in her feet and ankles that it is making it painful for her to walk No exertional chest pain   History reviewed. No pertinent past medical history.  There are no problems to display for this patient.   History reviewed. No pertinent surgical history.  OB History   No obstetric history on file.      Home Medications    Prior to Admission medications   Medication Sig Start Date End Date Taking? Authorizing Provider  cetirizine (ZYRTEC) 10 MG tablet Take 10 mg by mouth daily. 09/06/19   [provider]  diclofenac (VOLTAREN) 50 MG EC tablet Take 1 tablet (50 mg total) by mouth 2 (two) times daily. 05/28/19   Wieters, Hallie C, PA-C  furosemide (LASIX) 20 MG tablet Take 1 tablet (20 mg total) by mouth daily. 09/23/19   Eustace Moore, MD    HYDROcodone-acetaminophen (NORCO/VICODIN) 5-325 MG tablet Take 1 tablet by mouth every 6 (six) hours as needed. 07/23/19   [provider]  ibuprofen (ADVIL) 800 MG tablet Take 800 mg by mouth 3 (three) times daily as needed. 08/27/19   [provider]  Olopatadine HCl 0.2 % SOLN Apply 1 drop to eye every morning. 06/02/19   [provider]    Family History History reviewed. No pertinent family history.  Social History Social History   Tobacco Use  . Smoking status: Never Smoker  . Smokeless tobacco: Never Used  Substance Use Topics  . Alcohol use: Not on file  . Drug use: Not on file     Allergies   Almond oil, Oxycodone-acetaminophen, and Promethazine   Review of Systems Review of Systems See HPI  Physical Exam Triage Vital Signs ED Triage Vitals  Enc Vitals Group     BP 09/23/19 1828 (!) 149/84     Pulse Rate 09/23/19 1828 92     Resp 09/23/19 1828 18     Temp 09/23/19 1828 98.9 F (37.2 C)     Temp src --      SpO2 09/23/19 1828 100 %     Weight --      Height --      Head Circumference --  Peak Flow --      Pain Score 09/23/19 1826 4     Pain Loc --      Pain Edu? --      Excl. in GC? --    No data found.  Updated Vital Signs BP (!) 149/84 (BP Location: Right Arm)   Pulse 92   Temp 98.9 F (37.2 C)   Resp 18   SpO2 100%      Physical Exam Constitutional:      General: She is not in acute distress.    Appearance: She is well-developed. She is not ill-appearing.  HENT:     Head: Normocephalic and atraumatic.     Mouth/Throat:     Comments: Mask is in place Eyes:     Conjunctiva/sclera: Conjunctivae normal.     Pupils: Pupils are equal, round, and reactive to light.  Cardiovascular:     Rate and Rhythm: Normal rate and regular rhythm.     Heart sounds: Normal heart sounds. No murmur heard.  No gallop.   Pulmonary:     Effort: Pulmonary effort is normal. No respiratory distress.     Breath sounds: Normal breath  sounds.     Comments: Lungs are clear Abdominal:     General: There is no distension.     Palpations: Abdomen is soft.  Musculoskeletal:        General: Normal range of motion.     Cervical back: Normal range of motion.     Right lower leg: Edema present.     Left lower leg: Edema present.     Comments: 2+ pitting edema of ankles and feet  Skin:    General: Skin is warm and dry.  Neurological:     General: No focal deficit present.     Mental Status: She is alert.  Psychiatric:        Mood and Affect: Mood normal.        Behavior: Behavior normal.      UC Treatments / Results  Labs (all labs ordered are listed, but only abnormal results are displayed) Labs Reviewed  COMPREHENSIVE METABOLIC PANEL  CBC  BRAIN NATRIURETIC PEPTIDE  BNP is low  EKG= no ST or T wave changes.  Normal rate and rhythm.  Unchanged from prior   Radiology DG Chest 2 View  Result Date: 09/23/2019 CLINICAL DATA:  Initial evaluation for acute chest pressure, fluttering. EXAM: CHEST - 2 VIEW COMPARISON:  Prior radiograph from 05/28/2019. FINDINGS: Cardiac and mediastinal silhouettes are stable in size and contour, and remain within normal limits. Mild atherosclerotic change noted within the aortic arch. Lungs normally inflated. Mild scattered linear subsegmental atelectatic changes present at the lung bases bilaterally. No focal infiltrates or consolidative airspace disease. No pulmonary edema or pleural effusion. No pneumothorax. No acute osseous finding. Metallic ring overlying the L2 spinous process noted on lateral view, suspected to lie external to the patient. IMPRESSION: 1. Mild bibasilar subsegmental atelectasis. 2. No other active cardiopulmonary disease. 3.  Aortic Atherosclerosis (ICD10-I70.0). Electronically Signed   By: Rise Mu M.D.   On: 09/23/2019 20:05    Procedures Procedures (including critical care time)  Medications Ordered in UC Medications - No data to  display  Initial Impression / Assessment and Plan / UC Course  I have reviewed the triage vital signs and the nursing notes.  Pertinent labs & imaging results that were available during my care of the patient were reviewed by me and considered in my medical decision  making (see chart for details).     Symptoms worrisome for heart failure and PND.  Her chest x-ray, EKG, and lab work do not support this.  I am going to treat her edema with Lasix until she can get further evaluation.  She understands this will not be refilled.  She understands it is recommended to get blood work after she has been on this medicine for a week or 2. Final Clinical Impressions(s) / UC Diagnoses   Final diagnoses:  Pedal edema     Discharge Instructions     Take the Lasix pill once a day This will get rid of extra fluid Take it early in the morning You must eat more potassium foods, Lasix can lower your potassium  Check your blood test tomorrow on MyChart You will be called if your tests are abnormal I have referred you to cardiology.  You should hear from them by next week    ED Prescriptions    Medication Sig Dispense Auth. Provider   furosemide (LASIX) 20 MG tablet Take 1 tablet (20 mg total) by mouth daily. 30 tablet Eustace Moore, MD     PDMP not reviewed this encounter.   Eustace Moore, MD 09/23/19 2203

## 2019-09-23 NOTE — Discharge Instructions (Signed)
Take the Lasix pill once a day This will get rid of extra fluid Take it early in the morning You must eat more potassium foods, Lasix can lower your potassium  Check your blood test tomorrow on MyChart You will be called if your tests are abnormal I have referred you to cardiology.  You should hear from them by next week

## 2019-09-23 NOTE — ED Triage Notes (Signed)
Pt reports having chest pressure, chest fluttering, headache and swelling in foot and ankles x 2 days. States feeling she is drowning at night when she lay down.

## 2019-09-27 ENCOUNTER — Other Ambulatory Visit: Payer: Self-pay

## 2019-09-27 DIAGNOSIS — R202 Paresthesia of skin: Secondary | ICD-10-CM

## 2019-09-29 ENCOUNTER — Encounter: Payer: Medicaid Other | Admitting: Neurology

## 2019-09-29 NOTE — Progress Notes (Signed)
Cardiology Office Note:    Date:  09/30/2019   ID:  Gina Mendez, DOB August 12, 1970, MRN [id number]  PCP:  Patient, No Pcp Per  Cardiologist:  No primary care provider on file.  Electrophysiologist:  None   Referring MD: Raylene Everts, MD   Chief Complaint  Patient presents with  . Chest Pain    History of Present Illness:    Gina Mendez is a 49 y.o. female with no significant past medical history who is referred by Dr. Meda Coffee for evaluation of chest pain and lower extremity edema.  She was seen in urgent care on 09/23/2019 with chest pain and lower extremity edema.  Chest x-ray showed mild bibasilar atelectasis but otherwise unremarkable.  Labs on 09/23/2019 showed creatinine 0.8, albumin 3.7, BNP 20, hemoglobin 13.  She was started on Lasix 20 mg daily.   She reports that she has been having chest pain for the last 2 years.  Describes left-sided stabbing chest pain.  Occurs around twice per week, can last for hours.  She has not noticed what brings it on, as can occur at rest.  However she has noticed walking up stairs sometimes she will have chest pain.  She also reports she gets short of breath walking up stairs.  She does not exercise.  Reports even having a fluttering feeling in her nearly every day they can last for hours.  In addition, she has been having lower extremity edema over the last year.  Thinks she has gained about 50 pounds over the last year.  Since he started on Lasix at urgent care, her weight is down 10 pounds in the last week.  She has not taken Lasix for last 2 days.  No smoking history.  Family history includes sister died of heart failure at age 65, thought to be due to substance abuse but also had an MI.  Mother also had heart failure, thought to be due to substance abuse.    Wt Readings from Last 3 Encounters:  09/30/19 210 lb 12.8 oz (95.6 kg)     No past medical history on file.  No past surgical history on file.  Current Medications: Current  Meds  Medication Sig  . albuterol (VENTOLIN HFA) 108 (90 Base) MCG/ACT inhaler Inhale 1-2 puffs into the lungs every 6 (six) hours as needed for wheezing or shortness of breath.  . cetirizine (ZYRTEC) 10 MG tablet Take 10 mg by mouth daily.  . furosemide (LASIX) 20 MG tablet Take 1 tablet (20 mg total) by mouth as needed (weight gain of 3 lbs overnight or 5 lbs in 1 week).  Marland Kitchen HYDROcodone-acetaminophen (NORCO/VICODIN) 5-325 MG tablet Take 1 tablet by mouth every 6 (six) hours as needed.  Marland Kitchen ibuprofen (ADVIL) 800 MG tablet Take 800 mg by mouth 3 (three) times daily as needed.  . Olopatadine HCl 0.2 % SOLN Apply 1 drop to eye every morning.  . [DISCONTINUED] furosemide (LASIX) 20 MG tablet Take 20 mg by mouth 2 (two) times daily.   Current Facility-Administered Medications for the 09/30/19 encounter (Office Visit) with Donato Heinz, MD  Medication  . triamcinolone acetonide (KENALOG) 10 MG/ML injection 10 mg  . triamcinolone acetonide (KENALOG) 10 MG/ML injection 10 mg     Allergies:   Almond oil, Oxycodone-acetaminophen, and Promethazine   Social History   Socioeconomic History  . Marital status: Significant Other    Spouse name: Not on file  . Number of children: Not on file  . Years of education:  Not on file  . Highest education level: Not on file  Occupational History  . Not on file  Tobacco Use  . Smoking status: Never Smoker  . Smokeless tobacco: Never Used  Substance and Sexual Activity  . Alcohol use: Not on file  . Drug use: Not on file  . Sexual activity: Not on file  Other Topics Concern  . Not on file  Social History Narrative  . Not on file   Social Determinants of Health   Financial Resource Strain:   . Difficulty of Paying Living Expenses: Not on file  Food Insecurity:   . Worried About Charity fundraiser in the Last Year: Not on file  . Ran Out of Food in the Last Year: Not on file  Transportation Needs:   . Lack of Transportation (Medical): Not  on file  . Lack of Transportation (Non-Medical): Not on file  Physical Activity:   . Days of Exercise per Week: Not on file  . Minutes of Exercise per Session: Not on file  Stress:   . Feeling of Stress : Not on file  Social Connections:   . Frequency of Communication with Friends and Family: Not on file  . Frequency of Social Gatherings with Friends and Family: Not on file  . Attends Religious Services: Not on file  . Active Member of Clubs or Organizations: Not on file  . Attends Archivist Meetings: Not on file  . Marital Status: Not on file     Family History: The patient's family history is not on file.  ROS:   Please see the history of present illness.     All other systems reviewed and are negative.  EKGs/Labs/Other Studies Reviewed:    The following studies were reviewed today:   EKG:  EKG is ordered today.  The ekg ordered today demonstrates normal sinus rhythm, rate 79, no ST/T abnormalities  Recent Labs: 09/23/2019: ALT 30; B Natriuretic Peptide 19.5; BUN 8; Creatinine, Ser 0.81; Hemoglobin 13.0; Platelets 383; Potassium 4.0; Sodium 138  Recent Lipid Panel No results found for: CHOL, TRIG, HDL, CHOLHDL, VLDL, LDLCALC, LDLDIRECT  Physical Exam:    VS:  BP 126/86   Pulse 79   Ht _0  (1.676 m)   Wt 210 lb 12.8 oz (95.6 kg)   BMI 34.02 kg/m     Wt Readings from Last 3 Encounters:  09/30/19 210 lb 12.8 oz (95.6 kg)     GEN:  Well nourished, well developed in no acute distress HEENT: Normal NECK: No JVD; No carotid bruits LYMPHATICS: No lymphadenopathy CARDIAC: RRR, no murmurs, rubs, gallops RESPIRATORY:  Clear to auscultation without rales, wheezing or rhonchi  ABDOMEN: Soft, non-tender, non-distended MUSCULOSKELETAL: Trace edema; No deformity  SKIN: Warm and dry NEUROLOGIC:  Alert and oriented x 3 PSYCHIATRIC:  Normal affect   ASSESSMENT:    1. Chest pain, unspecified type   2. Lower extremity edema   3. Palpitations    PLAN:     Chest pain: Atypical in description, though does state can be brought on by exertion.  Recommend coronary CTA for further evaluation.  Lower extremity edema: Reported significant lower extremity edema, has improved since starting Lasix.  Currently appears euvolemic.  BNP was normal at urgent care.  Will check echocardiogram to evaluate for structural heart disease.  Check lower extremity duplex to rule out DVT.  Will prescribe Lasix as needed to take if gains more than 3 pounds in a day or 5 pounds in  a week.  Will check BMP, magnesium.  Palpitations: Description concerning for arrhythmia.  Will check Zio patch x7 days  RTC in 3 months   Medication Adjustments/Labs and Tests Ordered: Current medicines are reviewed at length with the patient today.  Concerns regarding medicines are outlined above.  Orders Placed This Encounter  Procedures  . CT CORONARY MORPH W/CTA COR W/SCORE W/CA W/CM &/OR WO/CM  . CT CORONARY FRACTIONAL FLOW RESERVE DATA PREP  . CT CORONARY FRACTIONAL FLOW RESERVE FLUID ANALYSIS  . Basic metabolic panel  . Magnesium  . LONG TERM MONITOR (3-14 DAYS)  . EKG 12-Lead  . ECHOCARDIOGRAM COMPLETE  . VAS [place] LOWER EXTREMITY VENOUS (DVT)   Meds ordered this encounter  Medications  . metoprolol tartrate (LOPRESSOR) 50 MG tablet    Sig: Take 50 mg (1 tablet) TWO hours prior to CT    Dispense:  1 tablet    Refill:  0  . furosemide (LASIX) 20 MG tablet    Sig: Take 1 tablet (20 mg total) by mouth as needed (weight gain of 3 lbs overnight or 5 lbs in 1 week).    Dispense:  30 tablet    Refill:  1    Patient Instructions  Medication Instructions:  Take furosemide (Lasix) 20 mg AS NEEDED for weight increase of 3 lbs overnight or 5 lbs in 1 week   Weight daily and keep a log of weights.  *If you need a refill on your cardiac medications before your next appointment, please call your pharmacy*   Lab Work: BMET, [place] today  If you have labs (blood work) drawn today  and your tests are completely normal, you will receive your results only by: Marland Kitchen MyChart Message (if you have MyChart) OR . A paper copy in the mail If you have any lab test that is abnormal or we need to change your treatment, we will call you to review the results.   Testing/Procedures: Your physician has requested that you have an echocardiogram. Echocardiography is a painless test that uses sound waves to create images of your heart. It provides your doctor with information about the size and shape of your heart and how well your heart's chambers and valves are working. This procedure takes approximately one hour. There are no restrictions for this procedure.  Your physician has requested that you have a lower extremity venous duplex. This test is an ultrasound of the veins in the legs. It looks at venous blood flow that carries blood from the heart to the legs. Allow one hour for a Lower Venous exam. There are no restrictions or special instructions.   Coronary CTA-see instruction sheet below   ZIO XT- Long Term Monitor Instructions   Your physician has requested you wear your ZIO patch monitor 7 days.   This is a single patch monitor.  [employer] supplies one patch monitor per enrollment.  Additional stickers are not available.   Please do not apply patch if you will be having a Nuclear Stress Test, Echocardiogram, Cardiac CT, MRI, or Chest Xray during the time frame you would be wearing the monitor. The patch cannot be worn during these tests.  You cannot remove and re-apply the ZIO XT patch monitor.   Your ZIO patch monitor will be sent USPS Priority mail from [facility] directly to your home address. The monitor may also be mailed to a PO BOX if home delivery is not available.   It may take 3-5 days to receive your monitor  after you have been enrolled.   Once you have received you monitor, please review enclosed instructions.  Your monitor has already been registered assigning  a specific monitor serial # to you.   Applying the monitor   Shave hair from upper left chest.   Hold abrader disc by orange tab.  Rub abrader in 40 strokes over left upper chest as indicated in your monitor instructions.   Clean area with 4 enclosed alcohol pads .  Use all pads to assure are is cleaned thoroughly.  Let dry.   Apply patch as indicated in monitor instructions.  Patch will be place under collarbone on left side of chest with arrow pointing upward.   Rub patch adhesive wings for 2 minutes.Remove white label marked "1".  Remove white label marked "2".  Rub patch adhesive wings for 2 additional minutes.   While looking in a mirror, press and release button in center of patch.  A small green light will flash 3-4 times .  This will be your only indicator the monitor has been turned on.     Do not shower for the first 24 hours.  You may shower after the first 24 hours.   Press button if you feel a symptom. You will hear a small click.  Record Date, Time and Symptom in the Patient Log Book.   When you are ready to remove patch, follow instructions on last 2 pages of Patient Log Book.  Stick patch monitor onto last page of Patient Log Book.   Place Patient Log Book in [place] box.  Use locking tab on box and tape box closed securely.  The [employer] has [employer] on it.  Please place in mailbox as soon as possible.  Your physician should have your test results approximately 7 days after the monitor has been mailed back to [facility].   Call [place] at [phone] if you have questions regarding your ZIO XT patch monitor.  Call them immediately if you see an orange light blinking on your monitor.   If your monitor falls off in less than 4 days contact our Monitor department at [phone].  If your monitor becomes loose or falls off after 4 days call [employer] at [phone] for suggestions on securing your monitor.    Follow-Up: At [facility], you and your health needs are our priority.  As part of our continuing mission to provide you with exceptional heart care, we have created designated Provider Care Teams.  These Care Teams include your primary Cardiologist (physician) and Advanced Practice Providers (APPs -  Physician Assistants and Nurse Practitioners) who all work together to provide you with the care you need, when you need it.  We recommend signing up for the patient portal called "MyChart".  Sign up information is provided on this After Visit Summary.  MyChart is used to connect with patients for Virtual Visits (Telemedicine).  Patients are able to view lab/test results, encounter notes, upcoming appointments, etc.  Non-urgent messages can be sent to your provider as well.   To learn more about what you can do with MyChart, go to [email or web address].    Your next appointment:   3 month(s)  The format for your next appointment:   In Person  Provider:   Oswaldo Milian, MD    Other Instructions   Your cardiac CT will be scheduled at one of the below locations:   [facility] [address] [place], [place] [address] (  [place]) [place] [address] [place], [place] [address] [phone]  If scheduled at [facility], please arrive at the [facility] main entrance of [facility] 30 minutes prior to test start time. Proceed to the [facility] Radiology Department (first floor) to check-in and test prep.  If scheduled at [facility], please arrive 15 mins early for check-in and test prep.  Please follow these instructions carefully (unless otherwise directed):  Hold all erectile dysfunction medications at least 3 days (72 hrs) prior to test.  On the Night Before the Test: . Be sure to Drink plenty of water. . Do not consume any caffeinated/decaffeinated beverages or chocolate 12  hours prior to your test. . Do not take any antihistamines 12 hours prior to your test.  On the Day of the Test: . Drink plenty of water. Do not drink any water within one hour of the test. . Do not eat any food 4 hours prior to the test. . You may take your regular medications prior to the test.  . Take metoprolol (Lopressor) two hours prior to test. . HOLD Furosemide morning of the test. . FEMALES- please wear underwire-free bra if available      After the Test: . Drink plenty of water. . After receiving IV contrast, you may experience a mild flushed feeling. This is normal. . On occasion, you may experience a mild rash up to 24 hours after the test. This is not dangerous. If this occurs, you can take Benadryl 25 mg and increase your fluid intake. . If you experience trouble breathing, this can be serious. If it is severe call 911 IMMEDIATELY. If it is mild, please call our office. . If you take any of these medications: Glipizide/Metformin, Avandament, Glucavance, please do not take 48 hours after completing test unless otherwise instructed.   Once we have confirmed authorization from your insurance company, we will call you to set up a date and time for your test. Based on how quickly your insurance processes prior authorizations requests, please allow up to 4 weeks to be contacted for scheduling your Cardiac CT appointment. Be advised that routine Cardiac CT appointments could be scheduled as many as 8 weeks after your provider has ordered it.  For non-scheduling related questions, please contact the cardiac imaging nurse navigator should you have any questions/concerns: Marchia Bond, Cardiac Imaging Nurse Navigator Burley Saver, Interim Cardiac Imaging Nurse [place] [facility] Direct Office Dial: [phone]   For scheduling needs, including cancellations and rescheduling, please call Vivien Rota at [phone], option 3.        Signed, Donato Heinz, MD  09/30/2019 5:38 PM    [place]

## 2019-09-30 ENCOUNTER — Encounter: Payer: Self-pay | Admitting: *Deleted

## 2019-09-30 ENCOUNTER — Other Ambulatory Visit: Payer: Self-pay

## 2019-09-30 ENCOUNTER — Encounter: Payer: Self-pay | Admitting: Cardiology

## 2019-09-30 ENCOUNTER — Ambulatory Visit (INDEPENDENT_AMBULATORY_CARE_PROVIDER_SITE_OTHER): Payer: Medicaid Other | Admitting: Cardiology

## 2019-09-30 VITALS — BP 126/86 | HR 79 | Ht 66.0 in | Wt 210.8 lb

## 2019-09-30 DIAGNOSIS — R600 Localized edema: Secondary | ICD-10-CM

## 2019-09-30 DIAGNOSIS — R002 Palpitations: Secondary | ICD-10-CM | POA: Diagnosis not present

## 2019-09-30 DIAGNOSIS — R079 Chest pain, unspecified: Secondary | ICD-10-CM

## 2019-09-30 MED ORDER — FUROSEMIDE 20 MG PO TABS
20.0000 mg | ORAL_TABLET | ORAL | 1 refills | Status: DC | PRN
Start: 2019-09-30 — End: 2023-05-29

## 2019-09-30 MED ORDER — METOPROLOL TARTRATE 50 MG PO TABS: ORAL_TABLET | ORAL | 0 refills | Status: DC

## 2019-09-30 NOTE — Progress Notes (Signed)
Patient ID: Gina Mendez, female   DOB: [birth year], 49 y.o.   MRN: [id number] Patient enrolled for [employer] to ship a 7 day ZIO XT long term holter monitor to her home.

## 2019-09-30 NOTE — Patient Instructions (Addendum)
Medication Instructions:  Take furosemide (Lasix) 20 mg AS NEEDED for weight increase of 3 lbs overnight or 5 lbs in 1 week   Weight daily and keep a log of weights.  *If you need a refill on your cardiac medications before your next appointment, please call your pharmacy*   Lab Work: BMET, [place] today  If you have labs (blood work) drawn today and your tests are completely normal, you will receive your results only by: Marland Kitchen MyChart Message (if you have MyChart) OR . A paper copy in the mail If you have any lab test that is abnormal or we need to change your treatment, we will call you to review the results.   Testing/Procedures: Your physician has requested that you have an echocardiogram. Echocardiography is a painless test that uses sound waves to create images of your heart. It provides your doctor with information about the size and shape of your heart and how well your heart's chambers and valves are working. This procedure takes approximately one hour. There are no restrictions for this procedure.  Your physician has requested that you have a lower extremity venous duplex. This test is an ultrasound of the veins in the legs. It looks at venous blood flow that carries blood from the heart to the legs. Allow one hour for a Lower Venous exam. There are no restrictions or special instructions.   Coronary CTA-see instruction sheet below   ZIO XT- Long Term Monitor Instructions   Your physician has requested you wear your ZIO patch monitor 7 days.   This is a single patch monitor.  [employer] supplies one patch monitor per enrollment.  Additional stickers are not available.   Please do not apply patch if you will be having a Nuclear Stress Test, Echocardiogram, Cardiac CT, MRI, or Chest Xray during the time frame you would be wearing the monitor. The patch cannot be worn during these tests.  You cannot remove and re-apply the ZIO XT patch monitor.   Your ZIO patch monitor will be sent USPS  Priority mail from [facility] directly to your home address. The monitor may also be mailed to a PO BOX if home delivery is not available.   It may take 3-5 days to receive your monitor after you have been enrolled.   Once you have received you monitor, please review enclosed instructions.  Your monitor has already been registered assigning a specific monitor serial # to you.   Applying the monitor   Shave hair from upper left chest.   Hold abrader disc by orange tab.  Rub abrader in 40 strokes over left upper chest as indicated in your monitor instructions.   Clean area with 4 enclosed alcohol pads .  Use all pads to assure are is cleaned thoroughly.  Let dry.   Apply patch as indicated in monitor instructions.  Patch will be place under collarbone on left side of chest with arrow pointing upward.   Rub patch adhesive wings for 2 minutes.Remove white label marked "1".  Remove white label marked "2".  Rub patch adhesive wings for 2 additional minutes.   While looking in a mirror, press and release button in center of patch.  A small green light will flash 3-4 times .  This will be your only indicator the monitor has been turned on.     Do not shower for the first 24 hours.  You may shower after the first 24 hours.   Press button if you feel a  symptom. You will hear a small click.  Record Date, Time and Symptom in the Patient Log Book.   When you are ready to remove patch, follow instructions on last 2 pages of Patient Log Book.  Stick patch monitor onto last page of Patient Log Book.   Place Patient Log Book in [place] box.  Use locking tab on box and tape box closed securely.  The [employer] has [employer] on it.  Please place in mailbox as soon as possible.  Your physician should have your test results approximately 7 days after the monitor has been mailed back to [facility].   Call [place] at [phone] if you have questions regarding  your ZIO XT patch monitor.  Call them immediately if you see an orange light blinking on your monitor.   If your monitor falls off in less than 4 days contact our Monitor department at [phone].  If your monitor becomes loose or falls off after 4 days call [employer] at [phone] for suggestions on securing your monitor.    Follow-Up: At [facility], you and your health needs are our priority.  As part of our continuing mission to provide you with exceptional heart care, we have created designated Provider Care Teams.  These Care Teams include your primary Cardiologist (physician) and Advanced Practice Providers (APPs -  Physician Assistants and Nurse Practitioners) who all work together to provide you with the care you need, when you need it.  We recommend signing up for the patient portal called "MyChart".  Sign up information is provided on this After Visit Summary.  MyChart is used to connect with patients for Virtual Visits (Telemedicine).  Patients are able to view lab/test results, encounter notes, upcoming appointments, etc.  Non-urgent messages can be sent to your provider as well.   To learn more about what you can do with MyChart, go to [email or web address].    Your next appointment:   3 month(s)  The format for your next appointment:   In Person  Provider:   Oswaldo Milian, MD    Other Instructions   Your cardiac CT will be scheduled at one of the below locations:   [facility] [address] [place], [place] [address] [phone]  [facility] [address] [place], [place] [address] [phone]  If scheduled at [facility], please arrive at the [facility] main entrance of [facility] 30 minutes prior to test start time. Proceed to the [facility] Radiology Department (first floor) to check-in and test prep.  If scheduled at [facility],  please arrive 15 mins early for check-in and test prep.  Please follow these instructions carefully (unless otherwise directed):  Hold all erectile dysfunction medications at least 3 days (72 hrs) prior to test.  On the Night Before the Test: . Be sure to Drink plenty of water. . Do not consume any caffeinated/decaffeinated beverages or chocolate 12 hours prior to your test. . Do not take any antihistamines 12 hours prior to your test.  On the Day of the Test: . Drink plenty of water. Do not drink any water within one hour of the test. . Do not eat any food 4 hours prior to the test. . You may take your regular medications prior to the test.  . Take metoprolol (Lopressor) two hours prior to test. . HOLD Furosemide morning of the test. . FEMALES- please wear underwire-free  bra if available      After the Test: . Drink plenty of water. . After receiving IV contrast, you may experience a mild flushed feeling. This is normal. . On occasion, you may experience a mild rash up to 24 hours after the test. This is not dangerous. If this occurs, you can take Benadryl 25 mg and increase your fluid intake. . If you experience trouble breathing, this can be serious. If it is severe call 911 IMMEDIATELY. If it is mild, please call our office. . If you take any of these medications: Glipizide/Metformin, Avandament, Glucavance, please do not take 48 hours after completing test unless otherwise instructed.   Once we have confirmed authorization from your insurance company, we will call you to set up a date and time for your test. Based on how quickly your insurance processes prior authorizations requests, please allow up to 4 weeks to be contacted for scheduling your Cardiac CT appointment. Be advised that routine Cardiac CT appointments could be scheduled as many as 8 weeks after your provider has ordered it.  For non-scheduling related questions, please contact the cardiac imaging nurse navigator  should you have any questions/concerns: Marchia Bond, Cardiac Imaging Nurse Navigator Burley Saver, Interim Cardiac Imaging Nurse [facility] Direct Office Dial: [phone]   For scheduling needs, including cancellations and rescheduling, please call Vivien Rota at [phone], option 3.

## 2019-10-01 ENCOUNTER — Ambulatory Visit (HOSPITAL_COMMUNITY)
Admission: RE | Admit: 2019-10-01 | Discharge: 2019-10-01 | Disposition: A | Discharge status: Home / Self Care | Payer: Medicaid Other | Source: Ambulatory Visit | Attending: Cardiology | Admitting: Cardiology

## 2019-10-01 DIAGNOSIS — R079 Chest pain, unspecified: Secondary | ICD-10-CM | POA: Insufficient documentation

## 2019-10-01 DIAGNOSIS — R600 Localized edema: Secondary | ICD-10-CM | POA: Diagnosis not present

## 2019-10-01 LAB — BASIC METABOLIC PANEL
BUN/Creatinine Ratio: 9 (ref 9–23)
BUN: 7 mg/dL (ref 6–24)
CO2: 26 mmol/L (ref 20–29)
Calcium: 9.9 mg/dL (ref 8.7–10.2)
Chloride: 103 mmol/L (ref 96–106)
Creatinine, Ser: 0.79 mg/dL (ref 0.57–1.00)
GFR calc Af Amer: 102 mL/min/{1.73_m2} (ref >59)
GFR calc non Af Amer: 89 mL/min/{1.73_m2} (ref >59)
Glucose: 79 mg/dL (ref 65–99)
Potassium: 4.4 mmol/L (ref 3.5–5.2)
Sodium: 141 mmol/L (ref 134–144)

## 2019-10-01 LAB — MAGNESIUM: Magnesium: 1.8 mg/dL (ref 1.6–2.3)

## 2019-10-05 ENCOUNTER — Ambulatory Visit (INDEPENDENT_AMBULATORY_CARE_PROVIDER_SITE_OTHER): Payer: Medicaid Other

## 2019-10-05 ENCOUNTER — Telehealth: Payer: Self-pay | Admitting: Cardiology

## 2019-10-05 DIAGNOSIS — R002 Palpitations: Secondary | ICD-10-CM | POA: Diagnosis not present

## 2019-10-05 NOTE — Telephone Encounter (Signed)
Can we see if we can schedule her coronary CTA sooner?

## 2019-10-05 NOTE — Telephone Encounter (Signed)
Called patient back about her message. Patient stated most of the time her chest pain happens at night when she is sleeping and it wakes her up. Patient stated she just got her monitor and will start to wear it. Patient stated she is having to take the furosemide more than directed. Patient stated she is taking furosemide 40 mg daily. Patient stated she will start off with 20 mg and if the swelling is not getting better she will take another 20 mg. Patient stated she has been doing this every day. Originally she was prescribed for furosemide 20 mg as needed daily for weight gain 3 lbs overnight or 5 lbs in a week. Patient stated her swelling is not going away and she was wondering if there was anything she can take for the chest pain. Encouraged patient to wear her monitor and get test done that were ordered at her appointment to rule out anything heart related that is causing her symptoms. Will forward to Dr. Bjorn Pippin for further advisement.

## 2019-10-05 NOTE — Telephone Encounter (Signed)
Pt c/o of Chest Pain: STAT if CP now or developed within 24 hours  1. Are you having CP right now? no  2. Are you experiencing any other symptoms (ex. SOB, nausea, vomiting, sweating)? Sweating, some SOB   3. How long have you been experiencing CP? For 4 or 5 months  4. Is your CP continuous or coming and going? Comes and goes, everyday  5. Have you taken Nitroglycerin? No   Patient states she is still having chest pain since her appointment 09/30/2019. She states her swelling also has not improved. She states she went to urgent care 10/01/2019, but the chest pain still comes and goes. She states it has been waking her up in the night ans she is very concerned and scared. She would like to know if there is something she can be prescribed to help.

## 2019-10-08 NOTE — Telephone Encounter (Signed)
Left message to call back. Message also sent to scheduler to see if Cardiac CTA could be moved up.

## 2019-10-12 ENCOUNTER — Other Ambulatory Visit: Payer: Self-pay

## 2019-10-12 ENCOUNTER — Ambulatory Visit (INDEPENDENT_AMBULATORY_CARE_PROVIDER_SITE_OTHER): Payer: Medicaid Other | Admitting: Neurology

## 2019-10-12 DIAGNOSIS — G8929 Other chronic pain: Secondary | ICD-10-CM

## 2019-10-12 DIAGNOSIS — M79671 Pain in right foot: Secondary | ICD-10-CM

## 2019-10-12 DIAGNOSIS — M5431 Sciatica, right side: Secondary | ICD-10-CM

## 2019-10-12 DIAGNOSIS — M5417 Radiculopathy, lumbosacral region: Secondary | ICD-10-CM | POA: Diagnosis not present

## 2019-10-12 NOTE — Procedures (Addendum)
[facility]  [address] [place], [address]  [place], [place] [address] Tel: [phone] Fax:  [phone] Test Date:  10/12/2019  Patient: Gina Mendez DOB: 04-19-1970 Physician: Nita Sickle, DO  Sex: Female Height: 5\' 6"  Ref Phys: , DPM  ID#: Sharl Ma Temp: 34.30C Technician:    Patient Complaints: This is a 49 year old female referred for evaluation of right leg and foot pain.  NCV & EMG Findings: Extensive electrodiagnostic testing of the right lower extremity shows:  1. Right sural and superficial peroneal sensory responses are within normal limits. 2. Right peroneal and tibial motor responses are within normal limits. 3. Right tibial H reflex study is within normal limits. 4. There is no evidence of active or chronic motor axonal loss changes affecting any of the tested muscles.  Motor unit configuration and recruitment pattern is within normal limits.  Impression: This is a normal study of the right lower extremity.  In particular, there is no evidence of a sensorimotor polyneuropathy or lumbosacral radiculopathy.   ___________________________ 52, DO    Nerve Conduction Studies Anti Sensory Summary Table   Stim Site NR Peak (ms) Norm Peak (ms) P-T Amp (V) Norm P-T Amp  Right Sup Peroneal Anti Sensory (Ant Lat Mall)  34C  12 cm    2.5 <4.5 11.2 >5  Right Sural Anti Sensory (Lat Mall)  34C  Calf    2.6 <4.5 10.0 >5   Motor Summary Table   Stim Site NR Onset (ms) Norm Onset (ms) O-P Amp (mV) Norm O-P Amp Site1 Site2 Delta-0 (ms) Dist (cm) Vel (m/s) Norm Vel (m/s)  Right Peroneal Motor (Ext Dig Brev)  34C  Ankle    2.7 <5.5 4.2 >3 B Fib Ankle 8.2 41.0 50 >40  B Fib    10.9  3.6  Poplt B Fib 1.7 9.0 53 >40  Poplt    12.6  3.6         Right Tibial Motor (Abd Hall Brev)  34C  Ankle    2.0 <6.0 8.5 >8 Knee Ankle 8.9 44.0 49 >40  Knee    10.9  6.0          H Reflex Studies   NR H-Lat (ms) Lat Norm (ms) L-R H-Lat (ms)    Right Tibial (Gastroc)  34C     30.07 <35    EMG   Side Muscle Ins Act Fibs Psw Fasc Number Recrt Dur Dur. Amp Amp. Poly Poly. Comment  Right AntTibialis Nml Nml Nml Nml Nml Nml Nml Nml Nml Nml Nml Nml N/A  Right Gastroc Nml Nml Nml Nml Nml Nml Nml Nml Nml Nml Nml Nml N/A  Right Flex Dig Long Nml Nml Nml Nml Nml Nml Nml Nml Nml Nml Nml Nml N/A  Right RectFemoris Nml Nml Nml Nml Nml Nml Nml Nml Nml Nml Nml Nml N/A  Right GluteusMed Nml Nml Nml Nml Nml Nml Nml Nml Nml Nml Nml Nml N/A  Right BicepsFemS Nml Nml Nml Nml Nml Nml Nml Nml Nml Nml Nml Nml N/A      Waveforms:

## 2019-10-13 ENCOUNTER — Encounter (HOSPITAL_COMMUNITY): Payer: Self-pay

## 2019-10-13 ENCOUNTER — Other Ambulatory Visit: Payer: Medicaid Other

## 2019-10-13 ENCOUNTER — Ambulatory Visit (HOSPITAL_COMMUNITY): Payer: Medicaid Other | Attending: Cardiology

## 2019-10-13 NOTE — Progress Notes (Signed)
Verified appointment "no show" status with Henriette Combs at 15:58.

## 2019-10-14 ENCOUNTER — Encounter (HOSPITAL_COMMUNITY): Payer: Self-pay | Admitting: Cardiology

## 2019-10-17 ENCOUNTER — Encounter (HOSPITAL_COMMUNITY): Payer: Self-pay

## 2019-10-17 ENCOUNTER — Ambulatory Visit (HOSPITAL_COMMUNITY)
Admission: EM | Admit: 2019-10-17 | Discharge: 2019-10-17 | Disposition: A | Discharge status: Home / Self Care | Payer: Medicaid Other | Attending: Emergency Medicine | Admitting: Emergency Medicine

## 2019-10-17 ENCOUNTER — Other Ambulatory Visit: Payer: Self-pay

## 2019-10-17 NOTE — ED Triage Notes (Signed)
Pt present Chest pain, with fluttering. Symptoms started today. This has been going for six month but the chest started back again today.

## 2019-10-18 NOTE — Telephone Encounter (Signed)
Schedulers aware to schedule CTA asap, lmtcb.

## 2019-10-28 ENCOUNTER — Ambulatory Visit (INDEPENDENT_AMBULATORY_CARE_PROVIDER_SITE_OTHER): Payer: Medicaid Other | Admitting: Podiatry

## 2019-10-28 ENCOUNTER — Other Ambulatory Visit: Payer: Self-pay

## 2019-10-28 DIAGNOSIS — M722 Plantar fascial fibromatosis: Secondary | ICD-10-CM

## 2019-10-28 DIAGNOSIS — M216X1 Other acquired deformities of right foot: Secondary | ICD-10-CM | POA: Diagnosis not present

## 2019-10-28 DIAGNOSIS — M76821 Posterior tibial tendinitis, right leg: Secondary | ICD-10-CM

## 2019-10-28 DIAGNOSIS — M7671 Peroneal tendinitis, right leg: Secondary | ICD-10-CM

## 2019-10-28 DIAGNOSIS — M21861 Other specified acquired deformities of right lower leg: Secondary | ICD-10-CM

## 2019-10-28 DIAGNOSIS — M25571 Pain in right ankle and joints of right foot: Secondary | ICD-10-CM

## 2019-10-28 DIAGNOSIS — M25472 Effusion, left ankle: Secondary | ICD-10-CM

## 2019-10-28 DIAGNOSIS — G8929 Other chronic pain: Secondary | ICD-10-CM

## 2019-10-28 DIAGNOSIS — M25471 Effusion, right ankle: Secondary | ICD-10-CM

## 2019-10-28 NOTE — Progress Notes (Signed)
Subjective:  Patient ID: Gina Mendez, female    DOB: [birth year],  MRN: [id number]  Chief Complaint  Patient presents with  . Foot Pain      6wk f/u Rt ft pain/swelling     49 y.o. female returns with the above complaint. History confirmed with patient.  Still has some pain on the outside the lateral ankle.  Mild improvement since last visit.  She completed the nerve conduction testing and her lab work. Objective:  Physical Exam: warm, good capillary refill, no trophic changes or ulcerative lesions, normal DP and PT pulses and normal sensory exam.  Today she does have pain along the peroneal tendons as well as the posterior tibial and Achilles tendon.  Gastrocnemius equinus is noted..  She has 5 out of 5 muscle strength.  Healed scars from skin lesions on the lower leg   Study Result  Narrative & Impression  CLINICAL DATA:  Ankle pain and swelling for 1 year on the lateral side  EXAM: MRI OF THE RIGHT ANKLE WITHOUT CONTRAST  TECHNIQUE: Multiplanar, multisequence MR imaging of the ankle was performed. No intravenous contrast was administered.  COMPARISON:  Outside radiograph March 02, 2019  FINDINGS: TENDONS  Peroneal: Peroneal longus tendon intact. Peroneal brevis intact.  Posteromedial: There is a small amount of fluid seen surrounding the posterior tibialis tendon, however it is intact. Flexor hallucis longus tendon intact. Flexor digitorum longus tendon intact.  Anterior: Tibialis anterior tendon intact. Extensor hallucis longus tendon intact Extensor digitorum longus tendon intact.  Achilles: Intact. There is mildly increased signal seen within the 2 cm segment of Achilles tendon at the insertion site. A tiny amount of retrocalcaneal bursal fluid is seen.  Plantar Fascia: Intact. Minimal increased signa her l seen at the insertion site of the middle cord of the plantar fascia.  LIGAMENTS  Lateral: Anterior talofibular ligament intact.  Calcaneofibular ligament intact. Posterior talofibular ligament intact. Anterior and posterior tibiofibular ligaments intact.  Medial: Deltoid ligament intact. Spring ligament intact.  CARTILAGE  Ankle Joint: A trace ankle joint effusion is seen. Normal ankle mortise. No chondral defect.  Subtalar Joints/Sinus Tarsi: Normal subtalar joints. A small amount of subtalar joint fluid is seen. There is increased signal seen within the sinus tarsi. Bones: No marrow signal abnormality. No fracture, marrow edema, or pathologic marrow infiltration.  Soft Tissue: Mild heel pad inflammation is seen.  IMPRESSION: 1. Mild posterior tibialis tenosynovitis. 2. Achilles insertional tendinosis with trace retrocalcaneal bursitis. 3. Minimal middle cord plantar fasciitis. 4. Edema within the sinus tarsi with a small amount of subtalar joint fluid. This could be due to sinus tarsi syndrome. Please correlate with the patient's site of pain. 5. Heel pad inflammation.   Results  Collected Updated Procedure   09/14/2019 1123 09/17/2019 1640 B. burgdorfi antibodies by WB [[id number]]  (Abnormal)   Component Value  B burgdorferi IgG Abs (IB) NEGATIVE  Lyme Disease 18 kD IgG NON-REACTIVE  Lyme Disease 23 kD IgG NON-REACTIVE  Lyme Disease 28 kD IgG NON-REACTIVE  Lyme Disease 30 kD IgG NON-REACTIVE  Lyme Disease 39 kD IgG NON-REACTIVE  Lyme Disease 41 kD IgG NON-REACTIVE  Lyme Disease 45 kD IgG NON-REACTIVE  Lyme Disease 58 kD IgG NON-REACTIVE  Lyme Disease 66 kD IgG NON-REACTIVE  Lyme Disease 93 kD IgG NON-REACTIVE  B burgdorferi IgM Abs (IB) NEGATIVE  Lyme Disease 23 kD IgM NON-REACTIVE  Lyme Disease 39 kD IgM REACTIVE Abnormal   Lyme Disease 41 kD IgM NON-REACTIVE  09/14/2019 1123 09/17/2019 1640 HLA-B27 Antigen [[id number]]  Blood   Component Value  HLA-B27 Antigen NEGATIVE        09/14/2019 1123 09/17/2019 1640 ANA,IFA RA Diag Pnl w/rflx Tit/Patn [[id number]]  Blood    Component Value Units  Anti Nuclear Antibody (ANA) NEGATIVE    Rhuematoid fact SerPl-aCnc <72 IU/mL  Cyclic Citrullin Peptide Ab <16  UNITS  INTERPRETATION --          09/14/2019 1123 09/17/2019 1640 CBC with Differential [[id number]]  (Abnormal)  Blood   Component Value Units  WBC 3.6 Low  Thousand/uL  RBC 4.45 Million/uL  Hemoglobin 12.5 g/dL  HCT 37.4 %  MCV 84.0 fL  MCH 28.1 pg  MCHC 33.4 g/dL  RDW 12.6 %  Platelets 410 High  Thousand/uL  MPV 8.6 fL  Neutro Abs 1,062 Low  cells/uL  Lymphs Abs 2,009 cells/uL  Absolute Monocytes 320 cells/uL  Eosinophils Absolute 169 cells/uL  Basophils Absolute 40 cells/uL  Neutrophils Relative % 29.5 %  Total Lymphocyte 55.8 %  Monocytes Relative 8.9 %  Eosinophils Relative 4.7 %  Basophils Relative 1.1 %        09/14/2019 1123 09/17/2019 1640 Sedimentation Rate [[id number]]  Blood   Component Value Units  Sed Rate 9 mm/h     NCV & EMG Findings: Extensive electrodiagnostic testing of the right lower extremity shows:  1. Right sural and superficial peroneal sensory responses are within normal limits. 2. Right peroneal and tibial motor responses are within normal limits. 3. Right tibial H reflex study is within normal limits. 4. There is no evidence of active or chronic motor axonal loss changes affecting any of the tested muscles.  Motor unit configuration and recruitment pattern is within normal limits.   Impression: This is a normal study of the right lower extremity.  In particular, there is no evidence of a sensorimotor polyneuropathy or lumbosacral radiculopathy.  Assessment:   1. 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 Posterior tibial tendinitis, right   4. Plantar fasciitis, right   5. Peroneal tendinitis, right   6. Ankle edema, bilateral      Plan:  Patient was evaluated and treated and all questions answered.  -Majority of rheumatologic work-up as well as Lyme titers was  negative.  She did have positivity to a single IgM antibody.  This could be a false positive or latent reaction.  Recommend we retest her titers in 4 to 6 weeks  -No evidence of lumbosacral radiculopathy on her nerve testing, and today she had less neuritic symptoms  -Today symptoms are more consistent with peroneal, posterior tibial and Achilles tendinitis.  She previously was not able to wear a lace up ankle support brace due to edema.  There is now being controlled with Lasix and is improved significantly.  Recommend she begin wearing this again.  I would like her to go to physical therapy for range of motion, stability, and strengthening of the right lower extremity.  Referral sent to [facility] physical therapy.  -Ultimately would be beneficial to continue neurologic work-up.  I am referring her to [facility] rheumatology.   Return in about 6 weeks (around 12/09/2019).

## 2019-11-03 ENCOUNTER — Ambulatory Visit
Admission: RE | Admit: 2019-11-03 | Discharge: 2019-11-03 | Disposition: A | Discharge status: Home / Self Care | Payer: Medicaid Other | Source: Ambulatory Visit | Attending: Orthopedic Surgery | Admitting: Orthopedic Surgery

## 2019-11-03 ENCOUNTER — Other Ambulatory Visit: Payer: Self-pay

## 2019-11-03 DIAGNOSIS — M545 Low back pain, unspecified: Secondary | ICD-10-CM

## 2019-11-04 ENCOUNTER — Ambulatory Visit (HOSPITAL_COMMUNITY): Payer: Medicaid Other | Attending: Internal Medicine

## 2019-11-04 ENCOUNTER — Other Ambulatory Visit: Payer: Self-pay | Admitting: Physician Assistant

## 2019-11-04 ENCOUNTER — Other Ambulatory Visit: Payer: Self-pay

## 2019-11-04 ENCOUNTER — Telehealth: Payer: Self-pay | Admitting: Cardiology

## 2019-11-04 DIAGNOSIS — R002 Palpitations: Secondary | ICD-10-CM | POA: Diagnosis not present

## 2019-11-04 DIAGNOSIS — R3129 Other microscopic hematuria: Secondary | ICD-10-CM

## 2019-11-04 DIAGNOSIS — R079 Chest pain, unspecified: Secondary | ICD-10-CM

## 2019-11-04 LAB — ECHOCARDIOGRAM COMPLETE
Area-P 1/2: 3.48 cm2
S' Lateral: 2.8 cm

## 2019-11-04 NOTE — Telephone Encounter (Signed)
New Message:    Please call, concerning a Prior Athorization

## 2019-11-18 ENCOUNTER — Telehealth: Payer: Self-pay | Admitting: Cardiology

## 2019-11-18 NOTE — Telephone Encounter (Signed)
Patient is requesting to discuss results from echo completed on 11/04/19. Please call.

## 2019-11-18 NOTE — Telephone Encounter (Signed)
Attempted to call patient. Call went straight to VM which is not set up yet.  Will try again later.

## 2019-11-19 ENCOUNTER — Ambulatory Visit
Admission: RE | Admit: 2019-11-19 | Discharge: 2019-11-19 | Disposition: A | Discharge status: Home / Self Care | Payer: Medicaid Other | Source: Ambulatory Visit | Attending: Physician Assistant | Admitting: Physician Assistant

## 2019-11-19 DIAGNOSIS — R3129 Other microscopic hematuria: Secondary | ICD-10-CM

## 2019-11-22 ENCOUNTER — Encounter: Payer: Self-pay | Admitting: Podiatry

## 2019-11-23 NOTE — Telephone Encounter (Signed)
Spoke with pt to convey Dr. Campbell Lerner impression of normal echocardiogram results. Advised pt that she would be contacted to schedule a date and time for the coronary CT she needs soon. Will route to appropriate party for scheduling.

## 2019-11-30 ENCOUNTER — Telehealth (HOSPITAL_COMMUNITY): Payer: Self-pay | Admitting: Emergency Medicine

## 2019-11-30 NOTE — Telephone Encounter (Signed)
Reaching out to patient to offer assistance regarding upcoming cardiac imaging study; pt verbalizes understanding of appt date/time, parking situation and where to check in, pre-test NPO status and medications ordered, and verified current allergies; name and call back number provided for further questions should they arise [name] RN Navigator Cardiac Imaging [name] [phone] office [phone] cell  Pt requested to send instructions to her email. Pt states she was unable to pick up metoprolol prior so I called her pharm to have it reprepared.  Pt verbalized understanding to take it 2 hr prior to scan Gina Mendez

## 2019-12-01 ENCOUNTER — Ambulatory Visit (HOSPITAL_COMMUNITY): Admission: RE | Admit: 2019-12-01 | Payer: Medicaid Other | Source: Ambulatory Visit

## 2019-12-05 NOTE — Progress Notes (Deleted)
Office Visit Note  Patient: Gina Mendez             Date of Birth: [birth year]           MRN: [id number]             PCP: Patient, No Pcp Per Referring: Criselda Peaches, DPM Visit Date: 12/06/2019 Occupation: @GUAROCC @  Subjective:  No chief complaint on file.   History of Present Illness: Gina Mendez is a 49 y.o. female here for evaluation of nerve pain and right posterior tibial tendonitis.***  Labs reviewed 08/2019 BNP normal CBC normal CMP normal Lyme by WB 39kD IgM positive HLA-B27 negative ANA negative RF negative CCP negative ESR 9  Activities of Daily Living:  Patient reports morning stiffness for *** {minute/hour:19697}.   Patient {ACTIONS;DENIES/REPORTS:21021675::"Denies"} nocturnal pain.  Difficulty dressing/grooming: {ACTIONS;DENIES/REPORTS:21021675::"Denies"} Difficulty climbing stairs: {ACTIONS;DENIES/REPORTS:21021675::"Denies"} Difficulty getting out of chair: {ACTIONS;DENIES/REPORTS:21021675::"Denies"} Difficulty using hands for taps, buttons, cutlery, and/or writing: {ACTIONS;DENIES/REPORTS:21021675::"Denies"}  No Rheumatology ROS completed.   PMFS History:  There are no problems to display for this patient.   No past medical history on file.  No family history on file. No past surgical history on file. Social History   Social History Narrative  . Not on file    There is no immunization history on file for this patient.   Objective: Vital Signs: There were no vitals taken for this visit.   Physical Exam   Musculoskeletal Exam: ***  CDAI Exam: CDAI Score: -- Patient Global: --; Provider Global: -- Swollen: --; Tender: -- Joint Exam 12/06/2019   No joint exam has been documented for this visit   There is currently no information documented on the homunculus. Go to the Rheumatology activity and complete the homunculus joint exam.  Investigation: No additional findings.  Imaging: CT ABDOMEN PELVIS WO CONTRAST  Result  Date: 11/20/2019 CLINICAL DATA:  Right flank pain extending into the groin and right leg the past 6 months. Microscopic hematuria. EXAM: CT ABDOMEN AND PELVIS WITHOUT CONTRAST TECHNIQUE: Multidetector CT imaging of the abdomen and pelvis was performed following the standard protocol without IV contrast. COMPARISON:  Lumbar spine MRI dated 11/03/2019 FINDINGS: Lower chest: Borderline enlarged heart. Clear lung bases. Hepatobiliary: No focal liver abnormality is seen. No gallstones, gallbladder wall thickening, or biliary dilatation. Pancreas: Unremarkable. No pancreatic ductal dilatation or surrounding inflammatory changes. Spleen: Normal in size without focal abnormality. Adrenals/Urinary Tract: Adrenal glands are unremarkable. Kidneys are normal, without renal calculi, focal lesion, or hydronephrosis. Bladder is unremarkable. Stomach/Bowel: Small hiatal hernia. Normal appearing small bowel, appendix and colon. Vascular/Lymphatic: No significant vascular findings are present. No enlarged abdominal or pelvic lymph nodes. Reproductive: Mildly enlarged and heterogeneous uterus. Bilateral tubal ligation clips. 3.0 cm simple appearing left ovarian cyst/follicle. Unremarkable right ovary. Other: No abdominal wall hernia or abnormality. No abdominopelvic ascites. Musculoskeletal: Minimal lumbar and lower thoracic spine degenerative changes. IMPRESSION: 1. No acute abnormality. 2. No urinary tract calculi or other abnormalities seen. 3. Small hiatal hernia. 4. 3.0 cm simple appearing left ovarian cyst/follicle. This is almost certainly benign, and no specific imaging follow up is recommended according to the Society of Radiologists in Ultrasound 2010 consensus Conference Statement (D Clovis Riley et al. Management of Asymptomatic Ovarian and Other Adnexal Cysts Imaged at [place]: Society of Radiologists in [place] Statement 2010. Radiology 256 (Sept 2010): 109-323.). Electronically Signed   By: Claudie Revering  M.D.   On: 11/20/2019 22:59    Recent Labs: Lab Results  Component Value Date  WBC 6.1 09/23/2019   HGB 13.0 09/23/2019   PLT 383 09/23/2019   NA 141 09/30/2019   K 4.4 09/30/2019   CL 103 09/30/2019   CO2 26 09/30/2019   GLUCOSE 79 09/30/2019   BUN 7 09/30/2019   CREATININE 0.79 09/30/2019   BILITOT 0.9 09/23/2019   ALKPHOS 69 09/23/2019   AST 34 09/23/2019   ALT 30 09/23/2019   PROT 6.9 09/23/2019   ALBUMIN 3.7 09/23/2019   CALCIUM 9.9 09/30/2019   GFRAA 102 09/30/2019    Speciality Comments: No specialty comments available.  Procedures:  No procedures performed Allergies: Almond oil, Oxycodone-acetaminophen, and Promethazine   Assessment / Plan:     Visit Diagnoses: No diagnosis found.  Orders: No orders of the defined types were placed in this encounter.  No orders of the defined types were placed in this encounter.   Face-to-face time spent with patient was *** minutes. Greater than 50% of time was spent in counseling and coordination of care.  Follow-Up Instructions: No follow-ups on file.   Collier Salina, MD  Note - This record has been created using [employer].  Chart creation errors have been sought, but may not always  have been located. Such creation errors do not reflect on  the standard of medical care.

## 2019-12-06 ENCOUNTER — Ambulatory Visit: Payer: Medicaid Other | Admitting: Internal Medicine

## 2019-12-09 ENCOUNTER — Ambulatory Visit: Payer: Medicaid Other | Admitting: Podiatry

## 2019-12-17 ENCOUNTER — Telehealth: Payer: Self-pay | Admitting: Cardiology

## 2019-12-17 NOTE — Telephone Encounter (Signed)
Gina Mendez with [employer] [facility] is following up regarding duplicate authorization requests received for CT scan. She states they received 2 requests with code [id number]. She states 1 of the requests was authorized on 11/29/19 (auth#: [id number]). However, she would like to ensure that both requests do not need to be authorized prior to moving forward. Please return call to Gina Mendez discuss.  Phone#: [phone] (ext#: [phone])

## 2020-01-07 ENCOUNTER — Other Ambulatory Visit: Payer: Self-pay

## 2020-01-07 ENCOUNTER — Ambulatory Visit (HOSPITAL_COMMUNITY)
Admission: EM | Admit: 2020-01-07 | Discharge: 2020-01-07 | Disposition: A | Discharge status: Home / Self Care | Payer: Medicaid Other | Attending: Emergency Medicine | Admitting: Emergency Medicine

## 2020-01-07 ENCOUNTER — Encounter (HOSPITAL_COMMUNITY): Payer: Self-pay

## 2020-01-07 DIAGNOSIS — Z113 Encounter for screening for infections with a predominantly sexual mode of transmission: Secondary | ICD-10-CM | POA: Diagnosis not present

## 2020-01-07 DIAGNOSIS — M545 Low back pain, unspecified: Secondary | ICD-10-CM

## 2020-01-07 NOTE — ED Provider Notes (Signed)
[facility]    CSN: [id number] Arrival date & time: 01/07/20  1921      History   Chief Complaint Chief Complaint  Patient presents with  . Back Pain  . Other    STD Testing    HPI Gina Mendez is a 49 y.o. female.   Patient presents with bilateral lower back pain.  She states she has been evaluated for this several times; she had a CT scan of her abdomen and pelvis on 11/20/2019 which showed no acute abnormality, no calculi, small hiatal hernia, left ovarian cyst.  No recent falls or injury.  She denies numbness, weakness, paresthesias, saddle anesthesia, loss of bowel/bladder control, or other symptoms.  Patient has been seen by neurology for lumbosacral radiculopathy, sciatica of right side, chronic pain in right foot and ankle, paresthesias.  Patient also requests STD testing today.  She denies fever, rash, lesions, vaginal discharge, pelvic pain, or other symptoms.  The history is provided by the patient.    History reviewed. No pertinent past medical history.  There are no problems to display for this patient.   History reviewed. No pertinent surgical history.  OB History   No obstetric history on file.      Home Medications    Prior to Admission medications   Medication Sig Start Date End Date Taking? Authorizing Provider  albuterol (VENTOLIN HFA) 108 (90 Base) MCG/ACT inhaler Inhale 1-2 puffs into the lungs every 6 (six) hours as needed for wheezing or shortness of breath.    [provider]  cetirizine (ZYRTEC) 10 MG tablet Take 10 mg by mouth daily. 09/06/19   [provider]  ciprofloxacin (CIPRO) 500 MG tablet Take 500 mg by mouth 2 (two) times daily. 10/26/19   [provider]  furosemide (LASIX) 20 MG tablet Take 1 tablet (20 mg total) by mouth as needed (weight gain of 3 lbs overnight or 5 lbs in 1 week). 09/30/19   Little Ishikawa, MD  HYDROcodone-acetaminophen (NORCO/VICODIN) 5-325 MG tablet Take 1 tablet by  mouth every 6 (six) hours as needed. 07/23/19   [provider]  ibuprofen (ADVIL) 800 MG tablet Take 800 mg by mouth 3 (three) times daily as needed. 08/27/19   [provider]  meloxicam (MOBIC) 15 MG tablet Take 15 mg by mouth daily. 10/26/19   [provider]  metoprolol tartrate (LOPRESSOR) 50 MG tablet Take 50 mg (1 tablet) TWO hours prior to CT 09/30/19   Little Ishikawa, MD  Olopatadine HCl 0.2 % SOLN Apply 1 drop to eye every morning. 06/02/19   [provider]  pantoprazole (PROTONIX) 40 MG tablet Take 40 mg by mouth daily. 10/26/19   [provider]    Family History History reviewed. No pertinent family history.  Social History Social History   Tobacco Use  . Smoking status: Never Smoker  . Smokeless tobacco: Never Used     Allergies   Almond oil, Oxycodone-acetaminophen, Codeine, Flagyl [metronidazole], and Promethazine   Review of Systems Review of Systems  Constitutional: Negative for chills and fever.  HENT: Negative for ear pain and sore throat.   Eyes: Negative for pain and visual disturbance.  Respiratory: Negative for cough and shortness of breath.   Cardiovascular: Negative for chest pain and palpitations.  Gastrointestinal: Negative for abdominal pain and vomiting.  Genitourinary: Negative for dysuria and hematuria.  Musculoskeletal: Positive for back pain. Negative for arthralgias.  Skin: Negative for color change and rash.  Neurological: Negative for  syncope, weakness and numbness.  All other systems reviewed and are negative.    Physical Exam Triage Vital Signs ED Triage Vitals  Enc Vitals Group     BP 01/07/20 1942 (!) 152/74     Pulse Rate 01/07/20 1942 84     Resp 01/07/20 1942 17     Temp 01/07/20 1942 98 F (36.7 C)     Temp Source 01/07/20 1942 Oral     SpO2 01/07/20 1942 98 %     Weight --      Height --      Head Circumference --      Peak Flow --      Pain Score 01/07/20 1944 5      Pain Loc --      Pain Edu? --      Excl. in GC? --    No data found.  Updated Vital Signs BP (!) 152/74 (BP Location: Right Arm)   Pulse 84   Temp 98 F (36.7 C) (Oral)   Resp 17   SpO2 98%   Visual Acuity Right Eye Distance:   Left Eye Distance:   Bilateral Distance:    Right Eye Near:   Left Eye Near:    Bilateral Near:     Physical Exam Vitals and nursing note reviewed.  Constitutional:      General: She is not in acute distress.    Appearance: She is well-developed and well-nourished. She is not ill-appearing.  HENT:     Head: Normocephalic and atraumatic.     Mouth/Throat:     Mouth: Mucous membranes are moist.  Eyes:     Conjunctiva/sclera: Conjunctivae normal.  Cardiovascular:     Rate and Rhythm: Normal rate and regular rhythm.     Heart sounds: Normal heart sounds.  Pulmonary:     Effort: Pulmonary effort is normal. No respiratory distress.     Breath sounds: Normal breath sounds.  Abdominal:     General: Bowel sounds are normal.     Palpations: Abdomen is soft.     Tenderness: There is no abdominal tenderness. There is no right CVA tenderness, left CVA tenderness, guarding or rebound.  Musculoskeletal:        General: No swelling, tenderness, deformity, signs of injury or edema. Normal range of motion.     Cervical back: Neck supple.  Skin:    General: Skin is warm and dry.     Findings: No bruising, erythema, lesion or rash.  Neurological:     General: No focal deficit present.     Mental Status: She is alert and oriented to person, place, and time.     Sensory: No sensory deficit.     Motor: No weakness.     Gait: Gait normal.  Psychiatric:        Mood and Affect: Mood and affect and mood normal.        Behavior: Behavior normal.      UC Treatments / Results  Labs (all labs ordered are listed, but only abnormal results are displayed) Labs Reviewed  CERVICOVAGINAL ANCILLARY ONLY    EKG   Radiology No results  found.  Procedures Procedures (including critical care time)  Medications Ordered in UC Medications - No data to display  Initial Impression / Assessment and Plan / UC Course  I have reviewed the triage vital signs and the nursing notes.  Pertinent labs & imaging results that were available during my care of the patient were reviewed by  me and considered in my medical decision making (see chart for details).   Bilateral low back pain without sciatica.  Screening for STDs.  Instructed patient to take Tylenol or ibuprofen as needed for discomfort.  Instructed her to follow-up with her PCP if her symptoms are not improving.  Patient obtained vaginal self swab for testing today.  Instructed her to abstain from sexual activity until the test results are back.  Discussed that we will call her if her test results are positive.  Discussed that she and her sexual partner may require treatment at that time.  She agrees to plan of care.   Final Clinical Impressions(s) / UC Diagnoses   Final diagnoses:  Bilateral low back pain without sciatica, unspecified chronicity  Screening for STD (sexually transmitted disease)     Discharge Instructions     Follow-up with your primary care provider if your symptoms are not improving.    ED Prescriptions    None     I have reviewed the PDMP during this encounter.   Mickie Bail, NP 01/07/20 2059

## 2020-01-07 NOTE — Discharge Instructions (Addendum)
Follow up with your primary care provider if your symptoms are not improving.     

## 2020-01-07 NOTE — ED Triage Notes (Signed)
Pt presents for ongoing lower back pain.  Pt would also like STD Testing because she states he partner was being checked for possible STD and urinary tract issues.

## 2020-01-09 ENCOUNTER — Encounter (HOSPITAL_COMMUNITY): Payer: Self-pay | Admitting: Emergency Medicine

## 2020-01-09 ENCOUNTER — Emergency Department (HOSPITAL_COMMUNITY)
Admission: EM | Admit: 2020-01-09 | Discharge: 2020-01-09 | Disposition: A | Discharge status: Home / Self Care | Payer: Medicaid Other | Attending: Emergency Medicine | Admitting: Emergency Medicine

## 2020-01-09 ENCOUNTER — Other Ambulatory Visit: Payer: Self-pay

## 2020-01-09 DIAGNOSIS — J45909 Unspecified asthma, uncomplicated: Secondary | ICD-10-CM | POA: Insufficient documentation

## 2020-01-09 DIAGNOSIS — R319 Hematuria, unspecified: Secondary | ICD-10-CM

## 2020-01-09 DIAGNOSIS — M549 Dorsalgia, unspecified: Secondary | ICD-10-CM

## 2020-01-09 DIAGNOSIS — M545 Low back pain, unspecified: Secondary | ICD-10-CM | POA: Diagnosis not present

## 2020-01-09 LAB — URINALYSIS, ROUTINE W REFLEX MICROSCOPIC
Bacteria, UA: NONE SEEN
Bilirubin Urine: NEGATIVE
Glucose, UA: NEGATIVE mg/dL
Ketones, ur: NEGATIVE mg/dL
Leukocytes,Ua: NEGATIVE
Nitrite: NEGATIVE
Protein, ur: NEGATIVE mg/dL
Specific Gravity, Urine: 1.018 (ref 1.005–1.030)
pH: 6 (ref 5.0–8.0)

## 2020-01-09 MED ORDER — METHOCARBAMOL 500 MG PO TABS: 500.0000 mg | ORAL_TABLET | Freq: Two times a day (BID) | ORAL | 0 refills | Status: DC

## 2020-01-09 NOTE — ED Provider Notes (Signed)
[facility] EMERGENCY DEPARTMENT Provider Note   CSN: [id number] Arrival date & time: 01/09/20  9937     History Chief Complaint  Patient presents with  . Back Pain    Gina Mendez is a 49 y.o. female.  HPI Patient presented today with right-sided back pain for 1 month.  She states that it radiates to her right leg she states his symptoms have been ongoing and persistent and sometimes worsening.  She denies any urinary symptoms other than hematuria which she states has been going on intermittently for quite some time although she is uncertain exactly how long she has never seen a urologist for this she has been seen by multiple doctors and treated antibiotics several times.  She states the pain seems to be worse with movement and lifting.  She denies any abdominal pain nausea, vomiting, chest pain shortness of breath any weakness or numbness of the lower or upper extremities.  She denies any other significant symptoms.  She has not been taking any medications for her symptoms.   Broad differential for back pain considered includes malignancy, disc herniation, spinal epidural abscess, spinal fracture, cauda equina, pyelonephritis, kidney stone, AAA, AD, pancreatitis, PE and PTX.   History without symptoms of urinary or stool retention or incontinence, neurologic changes such as sensation change or weakness lower extremities, coagulopathy or blood thinner use, is not elderly or with history of osteoporosis, denies any history of cancer, fever, IV drug use, weight changes (unexplained), or prolonged steroid use.      Past Medical History:  Diagnosis Date  . Allergies    Per patient  . Asthma    Per patient    Patient Active Problem List   Diagnosis Date Noted  . Elevated sedimentation rate 01/12/2020  . Pain in right ankle and joints of right foot 01/12/2020  . Polyarthralgia 01/12/2020  . Rash and other nonspecific skin eruption 01/12/2020    Past  Surgical History:  Procedure Laterality Date  . CESAREAN SECTION  2005   Per patient  . PARTIAL HYSTERECTOMY     Per patient  . UMBILICAL HERNIA REPAIR     Per patient      OB History   No obstetric history on file.     No family history on file.  Social History   Tobacco Use  . Smoking status: Never Smoker  . Smokeless tobacco: Never Used  Vaping Use  . Vaping Use: Never used  Substance Use Topics  . Alcohol use: Not Currently  . Drug use: Not Currently    Home Medications Prior to Admission medications   Medication Sig Start Date End Date Taking? Authorizing Provider  albuterol (VENTOLIN HFA) 108 (90 Base) MCG/ACT inhaler Inhale 1-2 puffs into the lungs every 6 (six) hours as needed for wheezing or shortness of breath.    [provider]  cetirizine (ZYRTEC) 10 MG tablet Take 10 mg by mouth daily. 09/06/19   [provider]  ciprofloxacin (CIPRO) 500 MG tablet Take 500 mg by mouth 2 (two) times daily. Patient not taking: Reported on 01/12/2020 10/26/19   [provider]  clindamycin (CLEOCIN) 2 % vaginal cream Place 1 Applicatorful vaginally at bedtime. Patient not taking: Reported on 01/12/2020 01/11/20   Merrilee Jansky, MD  furosemide (LASIX) 20 MG tablet Take 1 tablet (20 mg total) by mouth as needed (weight gain of 3 lbs overnight or 5 lbs in 1 week). 09/30/19   Little Ishikawa, MD  HYDROcodone-acetaminophen (NORCO/VICODIN)  5-325 MG tablet Take 1 tablet by mouth every 6 (six) hours as needed. Patient not taking: Reported on 01/12/2020 07/23/19   [provider]  ibuprofen (ADVIL) 800 MG tablet Take 800 mg by mouth 3 (three) times daily as needed. 08/27/19   [provider]  meloxicam (MOBIC) 15 MG tablet Take 15 mg by mouth as needed. 10/26/19   [provider]  methocarbamol (ROBAXIN) 500 MG tablet Take 1 tablet (500 mg total) by mouth 2 (two) times daily. Patient not taking: Reported on 01/12/2020 01/09/20    Gailen Shelter, PA  metoprolol tartrate (LOPRESSOR) 50 MG tablet Take 50 mg (1 tablet) TWO hours prior to CT Patient not taking: Reported on 01/12/2020 09/30/19   Little Ishikawa, MD  Olopatadine HCl 0.2 % SOLN Apply 1 drop to eye every morning. 06/02/19   [provider]  omeprazole (PRILOSEC) 20 MG capsule Take 20 mg by mouth daily. 01/11/20   [provider]  pantoprazole (PROTONIX) 40 MG tablet Take 40 mg by mouth daily. 10/26/19   [provider]  topiramate (TOPAMAX) 50 MG tablet Take 50 mg by mouth as needed. 11/02/19   [provider]    Allergies    Almond oil, Oxycodone-acetaminophen, Codeine, Flagyl [metronidazole], and Promethazine  Review of Systems   Review of Systems  Constitutional: Negative for chills and fever.  HENT: Negative for congestion.   Eyes: Negative for pain.  Respiratory: Negative for cough and shortness of breath.   Cardiovascular: Negative for chest pain and leg swelling.  Gastrointestinal: Negative for abdominal pain, diarrhea, nausea and vomiting.  Genitourinary: Positive for hematuria. Negative for dysuria and pelvic pain.  Musculoskeletal: Positive for back pain. Negative for myalgias.  Skin: Negative for rash.  Neurological: Negative for dizziness and headaches.    Physical Exam Updated Vital Signs BP (!) 150/86   Pulse 71   Temp 98.4 F (36.9 C) (Oral)   Resp 19   Ht 5\' 6"  (1.676 m)   Wt 93.9 kg   SpO2 100%   BMI 33.41 kg/m   Physical Exam Vitals and nursing note reviewed.  Constitutional:      General: She is not in acute distress.    Comments: Pleasant well-appearing 49 year old.  In no acute distress.  Sitting comfortably in bed.  Able answer questions appropriately follow commands. No increased work of breathing. Speaking in full sentences.  HENT:     Head: Normocephalic and atraumatic.     Nose: Nose normal.  Eyes:     General: No scleral icterus. Cardiovascular:     Rate and Rhythm:  Normal rate and regular rhythm.     Pulses: Normal pulses.     Heart sounds: Normal heart sounds.  Pulmonary:     Effort: Pulmonary effort is normal. No respiratory distress.     Breath sounds: No wheezing.  Abdominal:     Palpations: Abdomen is soft.     Tenderness: There is no abdominal tenderness. There is no right CVA tenderness, left CVA tenderness, guarding or rebound.     Comments: No palpable masses  Musculoskeletal:     Cervical back: Normal range of motion.     Right lower leg: No edema.     Left lower leg: No edema.     Comments: Diffuse bilateral paralumbar muscular tenderness with trigger points.  Strength equal bilateral lower extremities.  Ambulatory without difficulty.  Good coordination with movement of lower extremities.  Skin:    General: Skin is warm  and dry.     Capillary Refill: Capillary refill takes less than 2 seconds.  Neurological:     Mental Status: She is alert. Mental status is at baseline.     Comments: Sensation intact bilateral lower extremities, normal gait  Psychiatric:        Mood and Affect: Mood normal.        Behavior: Behavior normal.     ED Results / Procedures / Treatments   Labs (all labs ordered are listed, but only abnormal results are displayed) Labs Reviewed  URINALYSIS, ROUTINE W REFLEX MICROSCOPIC - Abnormal; Notable for the following components:      Result Value   Hgb urine dipstick SMALL (*)    All other components within normal limits    EKG None  Radiology No results found.  Procedures Procedures (including critical care time)  Medications Ordered in ED Medications - No data to display  ED Course  I have reviewed the triage vital signs and the nursing notes.  Pertinent labs & imaging results that were available during my care of the patient were reviewed by me and considered in my medical decision making (see chart for details).    MDM Rules/Calculators/A&P                          Patient is 49 year old  female with past medical history detailed above presented today for 1 month of right-sided low back pain that radiates to her knee and ankle.  She states that it seems to be gradually worsening.  She has seen orthopedic in the past and had a shot of some kind potentially a steroid injection.  She states this helped for some time.  She cannot recall where she was seen.  Unrelated patient states that she has had hematuria for some time she has been seen by her family medicine doctor and an urgent care doctor for this in the past.  She is been placed on antibiotics she has never seen a urologist before.  Is well-appearing at this time.  Urinalysis is without any evidence of infection but there is hematuria present.  As is been ongoing problem we will have her follow-up with urology.  Her back pain seems to be of muscular origin.  We will provide her with Robaxin she has not been taking very much in the way of pain control.  She has not taken very much Tylenol or ibuprofen.  We will give her instructions for this.  We will give patient return precautions.  She has no central back pain no red flag symptoms.   I reviewed patient's medical record it appears that she had an MRI of her lumbar spine done by Dr. Dion Saucier 2 months ago.  Will defer to orthopedics for further care of his patients chronic back pain.  Final Clinical Impression(s) / ED Diagnoses Final diagnoses:  Acute right-sided back pain, unspecified back location  Hematuria, unspecified type    Rx / DC Orders ED Discharge Orders         Ordered    methocarbamol (ROBAXIN) 500 MG tablet  2 times daily        01/09/20 1339           Solon Augusta Conway, [place] 01/12/20 1326    Milagros Loll, MD 01/19/20 2355

## 2020-01-09 NOTE — ED Notes (Signed)
Patient education done on her f/u appts, keeping diary of symptoms and pain management. Patient given resource for yoga as requested. Patient with no further questions. DC to her boyfriends hospital room.

## 2020-01-09 NOTE — ED Triage Notes (Signed)
Pt reports R sided lower back pain x 1 month that radiates to R leg, knee, and ankle.  States pain is gradually worse.  States she has seen orthopedist for same in the past.  Also reports ongoing hematuria.  States she has been to doctor multiple times and taken antibiotics.

## 2020-01-09 NOTE — Discharge Instructions (Addendum)
Please keep your appointments.  Please try the Robaxin as prescribed.  Please follow up with your primary care doctor as well as a urologist given your persistent hematuria/blood in your urine.  I provided you with the phone number for [phone] here in [place].  Please use Tylenol or ibuprofen for pain.  You may use 600 mg ibuprofen every 6 hours or 1000 mg of Tylenol every 6 hours.  You may choose to alternate between the 2.  This would be most effective.  Not to exceed 4 g of Tylenol within 24 hours.  Not to exceed 3200 mg ibuprofen 24 hours.

## 2020-01-10 LAB — CERVICOVAGINAL ANCILLARY ONLY
Bacterial Vaginitis (gardnerella): POSITIVE — AB
Candida Glabrata: NEGATIVE
Candida Vaginitis: NEGATIVE
Chlamydia: NEGATIVE
Comment: NEGATIVE
Comment: NEGATIVE
Comment: NEGATIVE
Comment: NEGATIVE
Comment: NEGATIVE
Comment: NORMAL
Neisseria Gonorrhea: NEGATIVE
Trichomonas: NEGATIVE

## 2020-01-11 ENCOUNTER — Telehealth (HOSPITAL_COMMUNITY): Payer: Self-pay | Admitting: Emergency Medicine

## 2020-01-11 MED ORDER — CLINDAMYCIN PHOSPHATE 2 % VA CREA: 1.0000 | TOPICAL_CREAM | Freq: Every day | VAGINAL | 0 refills | Status: DC

## 2020-01-12 ENCOUNTER — Encounter: Payer: Self-pay | Admitting: Internal Medicine

## 2020-01-12 ENCOUNTER — Ambulatory Visit: Payer: Medicaid Other | Admitting: Internal Medicine

## 2020-01-12 ENCOUNTER — Other Ambulatory Visit: Payer: Self-pay

## 2020-01-12 DIAGNOSIS — R21 Rash and other nonspecific skin eruption: Secondary | ICD-10-CM

## 2020-01-12 DIAGNOSIS — M25571 Pain in right ankle and joints of right foot: Secondary | ICD-10-CM | POA: Diagnosis not present

## 2020-01-12 DIAGNOSIS — R700 Elevated erythrocyte sedimentation rate: Secondary | ICD-10-CM | POA: Diagnosis not present

## 2020-01-12 DIAGNOSIS — M2550 Pain in unspecified joint: Secondary | ICD-10-CM | POA: Insufficient documentation

## 2020-01-12 NOTE — Progress Notes (Signed)
 Office Visit Note  Patient: Gina Mendez             Date of Birth: 06/09/1970           MRN: [id number]             PCP: System, Provider Not In Referring: McDonald, Adam R, DPM Visit Date: 01/12/2020   Subjective:  New Patient (Initial Visit) and Joint Pain (Bilateral knees, bilateral ankles, bilateral wrists, bilateral elbows)   History of Present Illness: Gina Mendez is a 49 y.o. female here for evaluation of pain in multiple joints and elevated sedimentation rate. She has ben noticing pain in numerous locations including her elbows, wrists, knees, and ankles although ankle pain is the worst. Pain is ongoing since years ago with recent worsening. 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 She was noted to have tendinous inflammation of multiple sites with podiatry examination a few months ago. She also went for cardiology workup with new onset of bilateral pedal edema with normal echo in October. She has also noticed some erythematous or hyperpigmented areas on the skin. So far no particular treatment has improved her symptoms much.  Activities of Daily Living:  Patient reports morning stiffness for 1 hour.   Patient Reports nocturnal pain.  Difficulty dressing/grooming: Reports Difficulty climbing stairs: Reports Difficulty getting out of chair: Reports Difficulty using hands for taps, buttons, cutlery, and/or writing: Reports  Review of Systems  Constitutional: Positive for fatigue.  HENT: Negative for mouth sores, mouth dryness and nose dryness.   Eyes: Positive for pain, itching, visual disturbance and dryness.  Respiratory: Positive for cough. Negative for hemoptysis, shortness of breath and difficulty breathing.   Cardiovascular: Positive for chest pain, palpitations and swelling in legs/feet.  Gastrointestinal: Positive for abdominal pain and constipation. Negative for blood in stool and diarrhea.   Endocrine: Negative for increased urination.  Genitourinary: Negative for painful urination.  Musculoskeletal: Positive for arthralgias, joint pain, joint swelling, myalgias, muscle weakness, morning stiffness, muscle tenderness and myalgias.  Skin: Positive for rash. Negative for color change and redness.  Allergic/Immunologic: Negative for susceptible to infections.  Neurological: Positive for numbness, headaches and weakness. Negative for dizziness and memory loss.  Hematological: Negative for swollen glands.  Psychiatric/Behavioral: Positive for confusion and sleep disturbance.    PMFS History:  Patient Active Problem List   Diagnosis Date Noted  . Elevated sedimentation rate 01/12/2020  . Pain in right ankle and joints of right foot 01/12/2020  . Polyarthralgia 01/12/2020  . Rash and other nonspecific skin eruption 01/12/2020    Past Medical History:  Diagnosis Date  . Allergies    Per patient  . Asthma    Per patient    No family history on file. Past Surgical History:  Procedure Laterality Date  . CESAREAN SECTION  2005   Per patient  . PARTIAL HYSTERECTOMY     Per patient  . UMBILICAL HERNIA REPAIR     Per patient    Social History   Social History Narrative  . Not on file   Immunization History  Administered Date(s) Administered  . PFIZER SARS-COV-2 Vaccination 10/12/2019, 11/02/2019     Objective: Vital Signs: BP (!) 150/83 (BP Location: Left Arm, Patient Position: Sitting, Cuff Size: Normal)   Pulse 72   Ht 5' 6" (1.676 m)   Wt 209 lb (94.8 kg)   BMI 33.73 kg/m    Physical Exam Constitutional:        Appearance: She is obese.  HENT:     Right Ear: External ear normal.     Left Ear: External ear normal.     Mouth/Throat:     Mouth: Mucous membranes are moist.     Pharynx: Oropharynx is clear.  Eyes:     Conjunctiva/sclera: Conjunctivae normal.  Cardiovascular:     Rate and Rhythm: Normal rate and regular rhythm.  Pulmonary:     Effort:  Pulmonary effort is normal.     Breath sounds: Normal breath sounds.  Skin:    General: Skin is warm and dry.     Comments: Faint erythematous patch on anterior shin nontender, some hyperpigmented spots around ankle and shin  Neurological:     General: No focal deficit present.     Mental Status: She is alert.     Musculoskeletal Exam:  Neck full range of motion no tenderness Shoulder, elbow, wrist, fingers full range of motion no swelling Normal hip internal and external rotation Knees, ankles, MTPs full range of motion no swelling right foot and ankle tenderness   Investigation: No additional findings.  Imaging: No results found.  Recent Labs: Lab Results  Component Value Date   WBC 6.1 09/23/2019   HGB 13.0 09/23/2019   PLT 383 09/23/2019   NA 141 09/30/2019   K 4.4 09/30/2019   CL 103 09/30/2019   CO2 26 09/30/2019   GLUCOSE 79 09/30/2019   BUN 7 09/30/2019   CREATININE 0.79 09/30/2019   BILITOT 0.9 09/23/2019   ALKPHOS 69 09/23/2019   AST 34 09/23/2019   ALT 30 09/23/2019   PROT 6.9 09/23/2019   ALBUMIN 3.7 09/23/2019   CALCIUM 9.9 09/30/2019   GFRAA 102 09/30/2019    Speciality Comments: No specialty comments available.  Procedures:  No procedures performed Allergies: Almond oil, Oxycodone-acetaminophen, Codeine, Flagyl [metronidazole], and Promethazine   Assessment / Plan:     Visit Diagnoses: Elevated sedimentation rate Pain in right ankle and joints of right foot Polyarthralgia -  No specific inflammatory changes or synovitis on exam today. Knee imaging showed extensive cartilage loss although current problem maybe more enthesitis or tendinopathy versus joint inflammation. Will repeat ESR for any trend would benefit from repeat assessment in case more obvious inflammation present. Also screening for vitamin deficiency with paresthesia complaints.  Rash and other nonspecific skin eruption  Irregular erythematous skin patch does not look typical for  EN but this does usually have nondiagnostic histology. Will check ACE, vitamin D, fta.  Orders: Orders Placed This Encounter  Procedures  . Angiotensin converting enzyme  . Sedimentation rate  . Fluorescent treponemal ab(fta)-IgG-bld  . VITAMIN D 25 Hydroxy (Vit-D Deficiency, Fractures)  . Vitamin B12   No orders of the defined types were placed in this encounter.   Follow-Up Instructions: Return in about 4 weeks (around 02/09/2020) for Symptoms recheck and labs f/u.    W , MD  Note - This record has been created using Dragon software.  Chart creation errors have been sought, but may not always  have been located. Such creation errors do not reflect on  the standard of medical care.  

## 2020-01-12 NOTE — Patient Instructions (Addendum)
I recommend checking out the [place] fibromyalgia website for patient self care treatments: [email or web address]    Erythrocyte Sedimentation Rate Test Why am I having this test? The erythrocyte sedimentation rate (ESR) test is used to help find illnesses related to:  Sudden (acute) or long-term (chronic) infections.  Inflammation.  The body's disease-fighting system attacking healthy cells (autoimmune diseases).  Cancer.  Tissue death. If you have symptoms that may be related to any of these illnesses, your health care provider may do an ESR test before doing more specific tests. If you have an inflammatory immune disease, such as rheumatoid arthritis, you may have this test to help monitor your therapy. What is being tested? This test measures how long it takes for your red blood cells (erythrocytes) to settle in a solution over a certain amount of time (sedimentation rate). When you have an infection or inflammation, your red blood cells clump together and settle faster. The sedimentation rate provides information about how much inflammation is present in the body. What kind of sample is taken?  A blood sample is required for this test. It is usually collected by inserting a needle into a blood vessel. How do I prepare for this test? Follow any instructions from your health care provider about changing or stopping your regular medicines. Tell a health care provider about:  Any allergies you have.  All medicines you are taking, including vitamins, herbs, eye drops, creams, and over-the-counter medicines.  Any blood disorders you have.  Any surgeries you have had.  Any medical conditions you have, such as thyroid or kidney disease.  Whether you are pregnant or may be pregnant. How are the results reported? Your results will be reported as a value that measures sedimentation rate in millimeters per hour (mm/hr). Your health care provider will compare your  results to normal ranges that were established after testing a large group of people (reference values). Reference values may vary among labs and hospitals. For this test, common reference values, which vary by age and gender, are:  Newborn: 0-2 mm/hr.  Child, up to puberty: 0-10 mm/hr.  Female: ? Under 50 years: 0-20 mm/hr. ? 50-85 years: 0-30 mm/hr. ? Over 85 years: 0-42 mm/hr.  Female: ? Under 50 years: 0-15 mm/hr. ? 50-85 years: 0-20 mm/hr. ? Over 85 years: 0-30 mm/hr. Certain conditions or medicines may cause ESR levels to be falsely lower or higher, such as:  Pregnancy.  Obesity.  Steroids, birth control pills, and blood thinners.  Thyroid or kidney disease. What do the results mean? Results that are within reference values are considered normal, meaning that the level of inflammation in your body is healthy. High ESR levels mean that there is inflammation in your body. You will have more tests to help make a diagnosis. Inflammation may result from many different conditions or injuries. Talk with your health care provider about what your results mean. Questions to ask your health care provider Ask your health care provider, or the department that is doing the test:  When will my results be ready?  How will I get my results?  What are my treatment options?  What other tests do I need?  What are my next steps? Summary  The erythrocyte sedimentation rate (ESR) test is used to help find illnesses associated with sudden (acute) or long-term (chronic) infections, inflammation, autoimmune diseases, cancer, or tissue death.  If you have symptoms that may be related to any of these illnesses, your health care provider may do  an ESR test before doing more specific tests. If you have an inflammatory immune disease, such as rheumatoid arthritis, you may have this test to help monitor your therapy.  This test measures how long it takes for your red blood cells (erythrocytes) to  settle in a solution over a certain amount of time (sedimentation rate). This provides information about how much inflammation is present in the body.    Angiotensin-Converting Enzyme Test Why am I having this test? The angiotensin converting enzyme (ACE) test is ordered mainly to help diagnose and monitor sarcoidosis. Sarcoidosis is the presence of irritated and swollen (inflamed) cells in the body, especially in the lungs, lymph nodes, eyes, and skin. The inflamed cells form lumps (granulomas) in the body. The ACE test is also ordered to examine long-term (chronic) problems that are suspected to be caused by sarcoidosis. What is being tested? Angiotensin converting enzyme (ACE) is an enzyme produced in the body to help regulate blood pressure. In patients with sarcoidosis, the cells surrounding granulomas make increased amounts of ACE. As a result, the blood level of ACE may increase when sarcoidosis is present. What kind of sample is taken?  A blood sample is required for this test. It is usually collected by inserting a needle into a blood vessel. How do I prepare for this test?  Tell your health care provider if you take steroids or a type of blood pressure medicine called ACE inhibitors. Steroids and ACE inhibitors can decrease the levels of ACE in your blood. How are the results reported? Your test results will be reported as a range. Your health care provider will compare your results to normal ranges that were established after testing a large group of people (reference ranges). Reference ranges may vary among labs and hospitals. For this test, a common reference range is:  8-53 units/L. What do the results mean? Results that are within the reference range are considered normal, meaning that you have a normal amount of ACE in your blood. Results that are higher than the reference range may be caused by:  Sarcoidosis.  Other diseases such as Gaucher disease, tuberculosis, leprosy,  alcoholic liver disease (cirrhosis), certain types of cancer, and diabetes (diabetes mellitus). Talk with your health care provider about what your results mean. Questions to ask your health care provider Ask your health care provider, or the department that is doing the test:  When will my results be ready?  How will I get my results?  What are my treatment options?  What other tests do I need?  What are my next steps? Summary  The angiotensin converting enzyme (ACE) test is ordered mainly to help diagnose and monitor sarcoidosis.  The blood level of ACE may increase when sarcoidosis is present.  Tell your health care provider if you take steroids or a type of blood pressure medicine called ACE inhibitors. These medicines can decrease the levels of ACE in your blood.

## 2020-01-14 LAB — SEDIMENTATION RATE: Sed Rate: 6 mm/h (ref 0–20)

## 2020-01-14 LAB — FLUORESCENT TREPONEMAL AB(FTA)-IGG-BLD: Fluorescent Treponemal ABS: REACTIVE — AB

## 2020-01-14 LAB — VITAMIN B12: Vitamin B-12: 344 pg/mL (ref 200–1100)

## 2020-01-14 LAB — ANGIOTENSIN CONVERTING ENZYME: Angiotensin-Converting Enzyme: 43 U/L (ref 9–67)

## 2020-01-14 LAB — VITAMIN D 25 HYDROXY (VIT D DEFICIENCY, FRACTURES): Vit D, 25-Hydroxy: 17 ng/mL — ABNORMAL LOW (ref 30–100)

## 2020-01-17 ENCOUNTER — Telehealth: Payer: Self-pay | Admitting: Radiology

## 2020-01-17 NOTE — Telephone Encounter (Signed)
Elfredia Nevins from the [facility] Department requested a return call regarding patient's Syphilis test.  #[phone]

## 2020-01-17 NOTE — Telephone Encounter (Signed)
Per patient her boyfriend had syphilis at 49 years old, patient was tested around 64/49 years old and has hx of gonorrhea, patient would like to know if syphilis can come back around several years later and "flare up"?

## 2020-01-17 NOTE — Telephone Encounter (Signed)
Spoke with Gina Mendez, will plan to add on RPR testing to better stratify risk of disease vs false positive.

## 2020-01-17 NOTE — Progress Notes (Signed)
I spoke with Gina Mendez she has normal inflammatory markers. 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 ACE normal. Vitamin D level is low she is taking 800 units daily supplement I recommended increasing to 2000 units for now and can repeat test later to see if this responds appropriately.

## 2020-01-18 NOTE — Telephone Encounter (Signed)
[facility] do not have a usable specimen for the RPR. Ms. Rhames would benefit form coming to have this checked rather than wait a month according to health department recommendation. I will place future lab order please ask her to come have this drawn.

## 2020-01-18 NOTE — Telephone Encounter (Signed)
I called patient, patient will have lab drawn on 01/19/2020.

## 2020-01-18 NOTE — Addendum Note (Signed)
Addended by: Fuller Plan on: 01/18/2020 11:55 AM   Modules accepted: Orders

## 2020-01-27 ENCOUNTER — Telehealth: Payer: Self-pay | Admitting: *Deleted

## 2020-01-27 NOTE — Telephone Encounter (Signed)
FYI - Dr. Dimple Casey had reached out to the Health Department regarding lab RPR,Health Department called regarding status of lab, I called patient, patient stated she had to go to work on the day she came in to have labs drawn and could not wait any longer, patient will try to have lab performed on 01/27/2020

## 2020-01-31 ENCOUNTER — Telehealth: Payer: Self-pay

## 2020-01-31 LAB — RPR: RPR Ser Ql: NONREACTIVE

## 2020-01-31 NOTE — Telephone Encounter (Signed)
Patient left a voicemail stating she missed a call from the office.

## 2020-01-31 NOTE — Progress Notes (Signed)
RPR test is negative. This generally means that either the initial antibody test is a false positive or that she was previously exposed but cleared a previous infection. In either case, she would not have an active infection.

## 2020-02-07 ENCOUNTER — Telehealth: Payer: Self-pay | Admitting: Internal Medicine

## 2020-02-07 NOTE — Telephone Encounter (Signed)
[facility] de[facility] calling to get updated RPR results. Fax# [phone] Please call with results.

## 2020-02-07 NOTE — Telephone Encounter (Signed)
Spoke with patient regarding her concerns with the call she received from the health department stating she had a positive RPR result. Fax has been sent to [facility] Dept with updated negative RPR results and patient is aware.

## 2020-02-15 ENCOUNTER — Ambulatory Visit: Payer: Medicaid Other | Admitting: Internal Medicine

## 2020-02-20 NOTE — Progress Notes (Deleted)
Office Visit Note  Patient: Gina Mendez             Date of Birth: 21-Apr-1970           MRN: [id number]             PCP: System, Provider Not In Referring: No ref. provider found Visit Date: 02/21/2020   Subjective:  No chief complaint on file.   History of Present Illness: Issa Luster is a 50 y.o. female here for follow up of multiple joint pains especially lower extremities with previous history of chondromalcia patella. Also erythematous skin rashes on distal legs. Since last visti labr esults were negative for vasculitis and syphilis screening tests likely one false positive vs previously cleared infection result. ***     No Rheumatology ROS completed.   PMFS History:  Patient Active Problem List   Diagnosis Date Noted  . Elevated sedimentation rate 01/12/2020  . Pain in right ankle and joints of right foot 01/12/2020  . Polyarthralgia 01/12/2020  . Rash and other nonspecific skin eruption 01/12/2020    Past Medical History:  Diagnosis Date  . Allergies    Per patient  . Asthma    Per patient    No family history on file. Past Surgical History:  Procedure Laterality Date  . CESAREAN SECTION  2005   Per patient  . PARTIAL HYSTERECTOMY     Per patient  . UMBILICAL HERNIA REPAIR     Per patient    Social History   Social History Narrative  . Not on file   Immunization History  Administered Date(s) Administered  . PFIZER(Purple Top)SARS-COV-2 Vaccination 10/12/2019, 11/02/2019     Objective: Vital Signs: There were no vitals taken for this visit.   Physical Exam   Musculoskeletal Exam: ***  CDAI Exam: CDAI Score: -- Patient Global: --; Provider Global: -- Swollen: --; Tender: -- Joint Exam 02/21/2020   No joint exam has been documented for this visit   There is currently no information documented on the homunculus. Go to the Rheumatology activity and complete the homunculus joint exam.  Investigation: No additional  findings.  Imaging: No results found.  Recent Labs: Lab Results  Component Value Date   WBC 6.1 09/23/2019   HGB 13.0 09/23/2019   PLT 383 09/23/2019   NA 141 09/30/2019   K 4.4 09/30/2019   CL 103 09/30/2019   CO2 26 09/30/2019   GLUCOSE 79 09/30/2019   BUN 7 09/30/2019   CREATININE 0.79 09/30/2019   BILITOT 0.9 09/23/2019   ALKPHOS 69 09/23/2019   AST 34 09/23/2019   ALT 30 09/23/2019   PROT 6.9 09/23/2019   ALBUMIN 3.7 09/23/2019   CALCIUM 9.9 09/30/2019   GFRAA 102 09/30/2019    Speciality Comments: No specialty comments available.  Procedures:  No procedures performed Allergies: Almond oil, Oxycodone-acetaminophen, Codeine, Flagyl [metronidazole], and Promethazine   Assessment / Plan:     Visit Diagnoses: No diagnosis found.  Orders: No orders of the defined types were placed in this encounter.  No orders of the defined types were placed in this encounter.   Face-to-face time spent with patient was *** minutes. Greater than 50% of time was spent in counseling and coordination of care.  Follow-Up Instructions: No follow-ups on file.   Fuller Plan, MD  Note - This record has been created using [employer].  Chart creation errors have been sought, but may not always  have been located. Such creation errors do not  reflect on  the standard of medical care.

## 2020-02-21 ENCOUNTER — Ambulatory Visit: Payer: Medicaid Other | Admitting: Internal Medicine

## 2020-03-09 ENCOUNTER — Other Ambulatory Visit: Payer: Self-pay | Admitting: Physician Assistant

## 2020-03-09 DIAGNOSIS — R3129 Other microscopic hematuria: Secondary | ICD-10-CM

## 2020-03-23 ENCOUNTER — Other Ambulatory Visit: Payer: Medicaid Other

## 2020-04-07 ENCOUNTER — Other Ambulatory Visit: Payer: Medicaid Other

## 2020-05-08 ENCOUNTER — Ambulatory Visit (INDEPENDENT_AMBULATORY_CARE_PROVIDER_SITE_OTHER): Payer: Medicaid Other | Admitting: Podiatry

## 2020-05-08 ENCOUNTER — Other Ambulatory Visit: Payer: Self-pay

## 2020-05-08 DIAGNOSIS — G8929 Other chronic pain: Secondary | ICD-10-CM

## 2020-05-08 DIAGNOSIS — M25571 Pain in right ankle and joints of right foot: Secondary | ICD-10-CM | POA: Diagnosis not present

## 2020-05-08 DIAGNOSIS — M7671 Peroneal tendinitis, right leg: Secondary | ICD-10-CM | POA: Diagnosis not present

## 2020-05-08 DIAGNOSIS — M722 Plantar fascial fibromatosis: Secondary | ICD-10-CM

## 2020-05-08 NOTE — Patient Instructions (Signed)
Look for Voltaren gel at the pharmacy over the counter or online (also known as diclofenac 1% gel). Apply to the painful areas 3-4x daily with the supplied dosing card. Allow to dry for 10 minutes before going into socks/shoes   Look for an ASO stabilizer ankle brace on [employer] and wear it   Physical therapy will be at [facility] on [address]

## 2020-05-09 ENCOUNTER — Encounter: Payer: Self-pay | Admitting: Podiatry

## 2020-05-09 NOTE — Progress Notes (Signed)
Subjective:  Patient ID: Gina Mendez, female    DOB: 12/05/1970,  MRN: [id number]  Chief Complaint  Patient presents with  . Foot Pain       right foot pain/swelling    50 y.o. female returns with the above complaint. History confirmed with patient.  Has been doing somewhat better until the right foot pain laterally returning got worse.  She had a back injection recently that has helped some with pain and swelling. Objective:  Physical Exam: warm, good capillary refill, no trophic changes or ulcerative lesions, normal DP and PT pulses and normal sensory exam.  Today she does have pain along the peroneal tendons as well as the posterior tibial and Achilles tendon.  Gastrocnemius equinus is noted..  She has 5 out of 5 muscle strength.  Healed scars from skin lesions on the lower leg   Study Result  Narrative & Impression  CLINICAL DATA:  Ankle pain and swelling for 1 year on the lateral side  EXAM: MRI OF THE RIGHT ANKLE WITHOUT CONTRAST  TECHNIQUE: Multiplanar, multisequence MR imaging of the ankle was performed. No intravenous contrast was administered.  COMPARISON:  Outside radiograph March 02, 2019  FINDINGS: TENDONS  Peroneal: Peroneal longus tendon intact. Peroneal brevis intact.  Posteromedial: There is a small amount of fluid seen surrounding the posterior tibialis tendon, however it is intact. Flexor hallucis longus tendon intact. Flexor digitorum longus tendon intact.  Anterior: Tibialis anterior tendon intact. Extensor hallucis longus tendon intact Extensor digitorum longus tendon intact.  Achilles: Intact. There is mildly increased signal seen within the 2 cm segment of Achilles tendon at the insertion site. A tiny amount of retrocalcaneal bursal fluid is seen.  Plantar Fascia: Intact. Minimal increased signa her l seen at the insertion site of the middle cord of the plantar fascia.  LIGAMENTS  Lateral: Anterior talofibular ligament  intact. Calcaneofibular ligament intact. Posterior talofibular ligament intact. Anterior and posterior tibiofibular ligaments intact.  Medial: Deltoid ligament intact. Spring ligament intact.  CARTILAGE  Ankle Joint: A trace ankle joint effusion is seen. Normal ankle mortise. No chondral defect.  Subtalar Joints/Sinus Tarsi: Normal subtalar joints. A small amount of subtalar joint fluid is seen. There is increased signal seen within the sinus tarsi. Bones: No marrow signal abnormality. No fracture, marrow edema, or pathologic marrow infiltration.  Soft Tissue: Mild heel pad inflammation is seen.  IMPRESSION: 1. Mild posterior tibialis tenosynovitis. 2. Achilles insertional tendinosis with trace retrocalcaneal bursitis. 3. Minimal middle cord plantar fasciitis. 4. Edema within the sinus tarsi with a small amount of subtalar joint fluid. This could be due to sinus tarsi syndrome. Please correlate with the patient's site of pain. 5. Heel pad inflammation.   Results  Collected Updated Procedure   09/14/2019 1123 09/17/2019 1640 B. burgdorfi antibodies by WB [[id number]]  (Abnormal)   Component Value  B burgdorferi IgG Abs (IB) NEGATIVE  Lyme Disease 18 kD IgG NON-REACTIVE  Lyme Disease 23 kD IgG NON-REACTIVE  Lyme Disease 28 kD IgG NON-REACTIVE  Lyme Disease 30 kD IgG NON-REACTIVE  Lyme Disease 39 kD IgG NON-REACTIVE  Lyme Disease 41 kD IgG NON-REACTIVE  Lyme Disease 45 kD IgG NON-REACTIVE  Lyme Disease 58 kD IgG NON-REACTIVE  Lyme Disease 66 kD IgG NON-REACTIVE  Lyme Disease 93 kD IgG NON-REACTIVE  B burgdorferi IgM Abs (IB) NEGATIVE  Lyme Disease 23 kD IgM NON-REACTIVE  Lyme Disease 39 kD IgM REACTIVE Abnormal   Lyme Disease 41 kD IgM NON-REACTIVE  09/14/2019 1123 09/17/2019 1640 HLA-B27 Antigen [[id number]]  Blood   Component Value  HLA-B27 Antigen NEGATIVE        09/14/2019 1123 09/17/2019 1640 ANA,IFA RA Diag Pnl w/rflx Tit/Patn [[id number]]  Blood    Component Value Units  Anti Nuclear Antibody (ANA) NEGATIVE    Rhuematoid fact SerPl-aCnc <46 IU/mL  Cyclic Citrullin Peptide Ab <16  UNITS  INTERPRETATION --          09/14/2019 1123 09/17/2019 1640 CBC with Differential [[id number]]  (Abnormal)  Blood   Component Value Units  WBC 3.6 Low  Thousand/uL  RBC 4.45 Million/uL  Hemoglobin 12.5 g/dL  HCT 37.4 %  MCV 84.0 fL  MCH 28.1 pg  MCHC 33.4 g/dL  RDW 12.6 %  Platelets 410 High  Thousand/uL  MPV 8.6 fL  Neutro Abs 1,062 Low  cells/uL  Lymphs Abs 2,009 cells/uL  Absolute Monocytes 320 cells/uL  Eosinophils Absolute 169 cells/uL  Basophils Absolute 40 cells/uL  Neutrophils Relative % 29.5 %  Total Lymphocyte 55.8 %  Monocytes Relative 8.9 %  Eosinophils Relative 4.7 %  Basophils Relative 1.1 %        09/14/2019 1123 09/17/2019 1640 Sedimentation Rate [[id number]]  Blood   Component Value Units  Sed Rate 9 mm/h     NCV & EMG Findings: Extensive electrodiagnostic testing of the right lower extremity shows:  1. Right sural and superficial peroneal sensory responses are within normal limits. 2. Right peroneal and tibial motor responses are within normal limits. 3. Right tibial H reflex study is within normal limits. 4. There is no evidence of active or chronic motor axonal loss changes affecting any of the tested muscles.  Motor unit configuration and recruitment pattern is within normal limits.   Impression: This is a normal study of the right lower extremity.  In particular, there is no evidence of a sensorimotor polyneuropathy or lumbosacral radiculopathy.  Assessment:   1. 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 Plantar fasciitis, right   4. Peroneal tendinitis, right      Plan:  Patient was evaluated and treated and all questions answered.  -Distal think this is multifactorial and there is some rheumatic disease at and it has not yet been fully elucidated as well as  complex lumbosacral pathology.  However considering the previous findings on her other MRI and her persistent pain I would like to order a new MRI to evaluate for possible surgical planning if there is any peroneal tear or other pathology associated with this.  I think she also would benefit from physical therapy, referral was sent.  Return in about 1 month (around 06/07/2020) for after MRI to review right ankle .

## 2020-05-17 ENCOUNTER — Ambulatory Visit: Payer: Medicaid Other | Admitting: Internal Medicine

## 2020-05-17 NOTE — Progress Notes (Deleted)
Office Visit Note  Patient: Gina Mendez             Date of Birth: 01/18/1971           MRN: [id number]             PCP: System, Provider Not In Referring: No ref. provider found Visit Date: 05/17/2020   Subjective:  No chief complaint on file.   History of Present Illness: Farrell Broerman is a 50 y.o. female here for follow up of joint pain in multiple sites especially involving the right ankle and skin rashes with previously elevated sedimentation rate though other disease specific testing was unrevealing so far.  Lab work at previous visit also with false positive syphilis screening, negative on confirmatory test.  She was recently seen again by podiatry with plan for repeating MRI given ongoing symptoms with the suspicion for inflammatory component versus tendinous injury.***     No Rheumatology ROS completed.   PMFS History:  Patient Active Problem List   Diagnosis Date Noted  . Elevated sedimentation rate 01/12/2020  . Pain in right ankle and joints of right foot 01/12/2020  . Polyarthralgia 01/12/2020  . Rash and other nonspecific skin eruption 01/12/2020    Past Medical History:  Diagnosis Date  . Allergies    Per patient  . Asthma    Per patient    No family history on file. Past Surgical History:  Procedure Laterality Date  . CESAREAN SECTION  2005   Per patient  . PARTIAL HYSTERECTOMY     Per patient  . UMBILICAL HERNIA REPAIR     Per patient    Social History   Social History Narrative  . Not on file   Immunization History  Administered Date(s) Administered  . PFIZER(Purple Top)SARS-COV-2 Vaccination 10/12/2019, 11/02/2019     Objective: Vital Signs: There were no vitals taken for this visit.   Physical Exam   Musculoskeletal Exam: ***  CDAI Exam: CDAI Score: -- Patient Global: --; Provider Global: -- Swollen: --; Tender: -- Joint Exam 05/17/2020   No joint exam has been documented for this visit   There is currently no  information documented on the homunculus. Go to the Rheumatology activity and complete the homunculus joint exam.  Investigation: No additional findings.  Imaging: No results found.  Recent Labs: Lab Results  Component Value Date   WBC 6.1 09/23/2019   HGB 13.0 09/23/2019   PLT 383 09/23/2019   NA 141 09/30/2019   K 4.4 09/30/2019   CL 103 09/30/2019   CO2 26 09/30/2019   GLUCOSE 79 09/30/2019   BUN 7 09/30/2019   CREATININE 0.79 09/30/2019   BILITOT 0.9 09/23/2019   ALKPHOS 69 09/23/2019   AST 34 09/23/2019   ALT 30 09/23/2019   PROT 6.9 09/23/2019   ALBUMIN 3.7 09/23/2019   CALCIUM 9.9 09/30/2019   GFRAA 102 09/30/2019    Speciality Comments: No specialty comments available.  Procedures:  No procedures performed Allergies: Almond oil, Oxycodone-acetaminophen, Codeine, Flagyl [metronidazole], and Promethazine   Assessment / Plan:     Visit Diagnoses: No diagnosis found.  ***  Orders: No orders of the defined types were placed in this encounter.  No orders of the defined types were placed in this encounter.    Follow-Up Instructions: No follow-ups on file.   Fuller Plan, MD  Note - This record has been created using [employer].  Chart creation errors have been sought, but may not always  have  been located. Such creation errors do not reflect on  the standard of medical care.

## 2020-05-21 NOTE — Progress Notes (Deleted)
   Office Visit Note  Patient: Gina Mendez             Date of Birth: 25-Aug-1970           MRN: [id number]             PCP: System, Provider Not In Referring: No ref. provider found Visit Date: 05/22/2020   Subjective:  No chief complaint on file.   History of Present Illness: Gina Mendez is a 50 y.o. female here for follow up ***     No Rheumatology ROS completed.   PMFS History:  Patient Active Problem List   Diagnosis Date Noted  . Elevated sedimentation rate 01/12/2020  . Pain in right ankle and joints of right foot 01/12/2020  . Polyarthralgia 01/12/2020  . Rash and other nonspecific skin eruption 01/12/2020    Past Medical History:  Diagnosis Date  . Allergies    Per patient  . Asthma    Per patient    No family history on file. Past Surgical History:  Procedure Laterality Date  . CESAREAN SECTION  2005   Per patient  . PARTIAL HYSTERECTOMY     Per patient  . UMBILICAL HERNIA REPAIR     Per patient    Social History   Social History Narrative  . Not on file   Immunization History  Administered Date(s) Administered  . PFIZER(Purple Top)SARS-COV-2 Vaccination 10/12/2019, 11/02/2019     Objective: Vital Signs: There were no vitals taken for this visit.   Physical Exam   Musculoskeletal Exam: ***  CDAI Exam: CDAI Score: -- Patient Global: --; Provider Global: -- Swollen: --; Tender: -- Joint Exam 05/22/2020   No joint exam has been documented for this visit   There is currently no information documented on the homunculus. Go to the Rheumatology activity and complete the homunculus joint exam.  Investigation: No additional findings.  Imaging: No results found.  Recent Labs: Lab Results  Component Value Date   WBC 6.1 09/23/2019   HGB 13.0 09/23/2019   PLT 383 09/23/2019   NA 141 09/30/2019   K 4.4 09/30/2019   CL 103 09/30/2019   CO2 26 09/30/2019   GLUCOSE 79 09/30/2019   BUN 7 09/30/2019   CREATININE 0.79  09/30/2019   BILITOT 0.9 09/23/2019   ALKPHOS 69 09/23/2019   AST 34 09/23/2019   ALT 30 09/23/2019   PROT 6.9 09/23/2019   ALBUMIN 3.7 09/23/2019   CALCIUM 9.9 09/30/2019   GFRAA 102 09/30/2019    Speciality Comments: No specialty comments available.  Procedures:  No procedures performed Allergies: Almond oil, Oxycodone-acetaminophen, Codeine, Flagyl [metronidazole], and Promethazine   Assessment / Plan:     Visit Diagnoses: No diagnosis found.  ***  Orders: No orders of the defined types were placed in this encounter.  No orders of the defined types were placed in this encounter.    Follow-Up Instructions: No follow-ups on file.   Fuller Plan, MD  Note - This record has been created using [employer].  Chart creation errors have been sought, but may not always  have been located. Such creation errors do not reflect on  the standard of medical care.

## 2020-05-22 ENCOUNTER — Ambulatory Visit: Payer: Medicaid Other | Admitting: Internal Medicine

## 2020-06-08 ENCOUNTER — Ambulatory Visit: Payer: Medicaid Other | Admitting: Podiatry

## 2020-08-25 ENCOUNTER — Emergency Department (HOSPITAL_COMMUNITY)
Admission: EM | Admit: 2020-08-25 | Discharge: 2020-08-25 | Disposition: A | Discharge status: Home / Self Care | Payer: Medicaid Other | Attending: Emergency Medicine | Admitting: Emergency Medicine

## 2020-08-25 ENCOUNTER — Emergency Department (HOSPITAL_COMMUNITY): Payer: Medicaid Other

## 2020-08-25 ENCOUNTER — Other Ambulatory Visit: Payer: Self-pay

## 2020-08-25 DIAGNOSIS — R002 Palpitations: Secondary | ICD-10-CM | POA: Diagnosis present

## 2020-08-25 DIAGNOSIS — N9489 Other specified conditions associated with female genital organs and menstrual cycle: Secondary | ICD-10-CM | POA: Insufficient documentation

## 2020-08-25 DIAGNOSIS — Z79899 Other long term (current) drug therapy: Secondary | ICD-10-CM | POA: Diagnosis not present

## 2020-08-25 DIAGNOSIS — R079 Chest pain, unspecified: Secondary | ICD-10-CM

## 2020-08-25 DIAGNOSIS — J45909 Unspecified asthma, uncomplicated: Secondary | ICD-10-CM | POA: Insufficient documentation

## 2020-08-25 DIAGNOSIS — U071 COVID-19: Secondary | ICD-10-CM | POA: Diagnosis not present

## 2020-08-25 LAB — I-STAT BETA HCG BLOOD, ED (MC, WL, AP ONLY): I-stat hCG, quantitative: <5 m[IU]/mL (ref <5)

## 2020-08-25 LAB — BASIC METABOLIC PANEL
Anion gap: 6 (ref 5–15)
BUN: 9 mg/dL (ref 6–20)
CO2: 24 mmol/L (ref 22–32)
Calcium: 8.9 mg/dL (ref 8.9–10.3)
Chloride: 107 mmol/L (ref 98–111)
Creatinine, Ser: 0.79 mg/dL (ref 0.44–1.00)
GFR, Estimated: >60 mL/min (ref >60)
Glucose, Bld: 107 mg/dL — ABNORMAL HIGH (ref 70–99)
Potassium: 4.1 mmol/L (ref 3.5–5.1)
Sodium: 137 mmol/L (ref 135–145)

## 2020-08-25 LAB — CBC
HCT: 38.8 % (ref 36.0–46.0)
Hemoglobin: 12.9 g/dL (ref 12.0–15.0)
MCH: 28.2 pg (ref 26.0–34.0)
MCHC: 33.2 g/dL (ref 30.0–36.0)
MCV: 84.7 fL (ref 80.0–100.0)
Platelets: 364 K/uL (ref 150–400)
RBC: 4.58 MIL/uL (ref 3.87–5.11)
RDW: 12.4 % (ref 11.5–15.5)
WBC: 4.7 K/uL (ref 4.0–10.5)
nRBC: 0 % (ref 0.0–0.2)

## 2020-08-25 LAB — MAGNESIUM: Magnesium: 2.1 mg/dL (ref 1.7–2.4)

## 2020-08-25 LAB — TROPONIN I (HIGH SENSITIVITY)
Troponin I (High Sensitivity): 3 ng/L
Troponin I (High Sensitivity): 3 ng/L (ref <18)

## 2020-08-25 LAB — TSH: TSH: 2.447 u[IU]/mL (ref 0.350–4.500)

## 2020-08-25 MED ORDER — LIDOCAINE VISCOUS HCL 2 % MT SOLN
15.0000 mL | Freq: Once | OROMUCOSAL | Status: AC
  Administered 2020-08-25: 15 mL via ORAL
  Filled 2020-08-25: qty 15

## 2020-08-25 MED ORDER — ALUM & MAG HYDROXIDE-SIMETH 200-200-20 MG/5ML PO SUSP
30.0000 mL | Freq: Once | ORAL | Status: AC
  Administered 2020-08-25: 30 mL via ORAL
  Filled 2020-08-25: qty 30

## 2020-08-25 NOTE — ED Provider Notes (Signed)
[facility] EMERGENCY DEPARTMENT Provider Note   CSN: [id number] Arrival date & time: 08/25/20  0456     History Chief Complaint  Patient presents with   Palpitations    Alahni Varone is a 50 y.o. female.  HPI Patient is a 50 year old female with a medical history as noted below.  She presents to the emergency department due to intermittent heart palpitations.  She states that she has been experiencing the symptoms for months but they began worsening once again last night.  She states that started around 10 PM and have been coming and going.  Worse when lying flat and better when sitting upright.  Reports mild shortness of breath as well as a dull ache in the central chest when they occur.  States that her symptoms are typically worse at night.  She has been evaluated by her PCP as well as cardiology and states that she was treated on losartan, Lasix, as well as metoprolol.  She recently wore a cardiac monitor and it was removed 1 week ago but she has not received the results.  Patient also notes that she tested positive for COVID-19 about 1 week ago.  She states she was initially having mild symptoms but these have mostly resolved.  She has been vaccinated for COVID-19 x2.    Past Medical History:  Diagnosis Date   Allergies    Per patient   Asthma    Per patient    Patient Active Problem List   Diagnosis Date Noted   Elevated sedimentation rate 01/12/2020   Pain in right ankle and joints of right foot 01/12/2020   Polyarthralgia 01/12/2020   Rash and other nonspecific skin eruption 01/12/2020    Past Surgical History:  Procedure Laterality Date   CESAREAN SECTION  2005   Per patient   PARTIAL HYSTERECTOMY     Per patient   UMBILICAL HERNIA REPAIR     Per patient      OB History   No obstetric history on file.     No family history on file.  Social History   Tobacco Use   Smoking status: Never   Smokeless tobacco: Never  Vaping Use    Vaping Use: Never used  Substance Use Topics   Alcohol use: Not Currently   Drug use: Not Currently    Home Medications Prior to Admission medications   Medication Sig Start Date End Date Taking? Authorizing Provider  albuterol (VENTOLIN HFA) 108 (90 Base) MCG/ACT inhaler Inhale 1-2 puffs into the lungs every 6 (six) hours as needed for wheezing or shortness of breath.    [provider]  cetirizine (ZYRTEC) 10 MG tablet Take 10 mg by mouth daily. 09/06/19   [provider]  ciprofloxacin (CIPRO) 500 MG tablet Take 500 mg by mouth 2 (two) times daily. Patient not taking: Reported on 01/12/2020 10/26/19   [provider]  clindamycin (CLEOCIN) 2 % vaginal cream Place 1 Applicatorful vaginally at bedtime. Patient not taking: Reported on 01/12/2020 01/11/20   Merrilee Jansky, MD  furosemide (LASIX) 20 MG tablet Take 1 tablet (20 mg total) by mouth as needed (weight gain of 3 lbs overnight or 5 lbs in 1 week). 09/30/19   Little Ishikawa, MD  HYDROcodone-acetaminophen (NORCO/VICODIN) 5-325 MG tablet Take 1 tablet by mouth every 6 (six) hours as needed. Patient not taking: Reported on 01/12/2020 07/23/19   [provider]  ibuprofen (ADVIL) 800 MG tablet Take 800 mg by mouth 3 (three)  times daily as needed. 08/27/19   [provider]  meloxicam (MOBIC) 15 MG tablet Take 15 mg by mouth as needed. 10/26/19   [provider]  methocarbamol (ROBAXIN) 500 MG tablet Take 1 tablet (500 mg total) by mouth 2 (two) times daily. Patient not taking: Reported on 01/12/2020 01/09/20   Gailen ShelterFondaw, Wylder S, PA  metoprolol tartrate (LOPRESSOR) 50 MG tablet Take 50 mg (1 tablet) TWO hours prior to CT Patient not taking: Reported on 01/12/2020 09/30/19   Little IshikawaSchumann, Christopher L, MD  Olopatadine HCl 0.2 % SOLN Apply 1 drop to eye every morning. 06/02/19   [provider]  omeprazole (PRILOSEC) 20 MG capsule Take 20 mg by mouth daily. 01/11/20   [provider]  pantoprazole (PROTONIX) 40 MG tablet Take 40 mg by mouth daily. 10/26/19   [provider]  topiramate (TOPAMAX) 50 MG tablet Take 50 mg by mouth as needed. 11/02/19   [provider]    Allergies    Almond oil, Oxycodone-acetaminophen, Codeine, Flagyl [metronidazole], and Promethazine  Review of Systems   Review of Systems  All other systems reviewed and are negative. Ten systems reviewed and are negative for acute change, except as noted in the HPI.   Physical Exam Updated Vital Signs BP (!) 156/94   Pulse 60   Temp 98.6 F (37 C) (Oral)   Resp 16   Ht 5\' 7"  (1.702 m)   Wt 90.7 kg   SpO2 100%   BMI 31.32 kg/m   Physical Exam Vitals and nursing note reviewed.  Constitutional:      General: She is not in acute distress.    Appearance: Normal appearance. She is not ill-appearing, toxic-appearing or diaphoretic.  HENT:     Head: Normocephalic and atraumatic.     Right Ear: External ear normal.     Left Ear: External ear normal.     Nose: Nose normal.     Mouth/Throat:     Mouth: Mucous membranes are moist.     Pharynx: Oropharynx is clear. No oropharyngeal exudate or posterior oropharyngeal erythema.  Eyes:     General: No scleral icterus.       Right eye: No discharge.        Left eye: No discharge.     Extraocular Movements: Extraocular movements intact.     Conjunctiva/sclera: Conjunctivae normal.  Neck:     Vascular: No carotid bruit.     Comments: No carotid bruits. Cardiovascular:     Rate and Rhythm: Normal rate and regular rhythm.     Pulses: Normal pulses.     Heart sounds: Normal heart sounds. No murmur heard.   No friction rub. No gallop.  Pulmonary:     Effort: Pulmonary effort is normal. No respiratory distress.     Breath sounds: Normal breath sounds. No stridor. No wheezing, rhonchi or rales.     Comments: Mild tenderness appreciated along the sternal region. Chest:     Chest wall: Tenderness present.  Abdominal:      General: Abdomen is flat.     Palpations: Abdomen is soft.     Tenderness: There is abdominal tenderness.     Comments: Abdomen is soft.  Mild tenderness appreciated along the epigastrium.  Musculoskeletal:        General: Normal range of motion.     Cervical back: Normal range of motion and neck supple. No tenderness.     Right lower leg: No edema.     Left  lower leg: No edema.     Comments: 2+ DP pulses.  No pedal edema appreciated.  Skin:    General: Skin is warm and dry.  Neurological:     General: No focal deficit present.     Mental Status: She is alert and oriented to person, place, and time.  Psychiatric:        Mood and Affect: Mood normal.        Behavior: Behavior normal.   ED Results / Procedures / Treatments   Labs (all labs ordered are listed, but only abnormal results are displayed) Labs Reviewed  BASIC METABOLIC PANEL - Abnormal; Notable for the following components:      Result Value   Glucose, Bld 107 (*)    All other components within normal limits  CBC  TSH  MAGNESIUM  I-STAT BETA HCG BLOOD, ED ([facility], [facility], [facility] ONLY)  TROPONIN I (HIGH SENSITIVITY)  TROPONIN I (HIGH SENSITIVITY)    EKG EKG Interpretation  Date/Time:  Friday August 25 2020 05:05:19 EDT Ventricular Rate:  60 PR Interval:  168 QRS Duration: 72 QT Interval:  428 QTC Calculation: 428 R Axis:   51 Text Interpretation: Normal sinus rhythm Normal ECG Confirmed by Norman Clay ([id number]) on 08/25/2020 8:51:50 AM  Radiology DG Chest Port 1 View  Result Date: 08/25/2020 CLINICAL DATA:  50 year old female with palpitations. Positive for COVID-19 5 days ago. EXAM: PORTABLE CHEST 1 VIEW COMPARISON:  Chest radiographs 09/23/2019. FINDINGS: Portable AP view at 0529 hours. Normal lung volumes. Mediastinal contours are stable, heart size at the upper limits of normal. Mild chronic basilar atelectasis or scarring appears stable since last year. Otherwise allowing for portable technique the lungs are clear.  No pneumothorax or pleural effusion identified. No acute osseous abnormality identified. Negative visible bowel gas, osseous structures. IMPRESSION: Stable borderline cardiomegaly and mild basilar atelectasis or scarring. No acute cardiopulmonary abnormality. Electronically Signed   By: Odessa Fleming M.D.   On: 08/25/2020 06:04    Procedures Procedures   Medications Ordered in ED Medications  alum & mag hydroxide-simeth (MAALOX/MYLANTA) 200-200-20 MG/5ML suspension 30 mL (30 mLs Oral Given 08/25/20 0809)    And  lidocaine (XYLOCAINE) 2 % viscous mouth solution 15 mL (15 mLs Oral Given 08/25/20 0809)   ED Course  I have reviewed the triage vital signs and the nursing notes.  Pertinent labs & imaging results that were available during my care of the patient were reviewed by me and considered in my medical decision making (see chart for details).    MDM Rules/Calculators/A&P                          Pt is a 50 y.o. female who presents to the emergency department due to intermittent palpitations that started last night.  Labs: CBC without abnormalities. BMP with a glucose of 107. Magnesium within normal limits at 2.1. I-STAT beta-hCG less than 5. Troponin of 3 with a repeat of 3. TSH within normal limits at 2.447.  Imaging: Chest x-ray shows stable borderline cardiomegaly and mild basilar atelectasis or scarring.  No acute cardiopulmonary abnormalities.  I, Placido Sou, PA-C, personally reviewed and evaluated these images and lab results as part of my medical decision-making.  Unsure the source of the patient's symptoms.  No tachycardia noted since arrival.  Patient is mildly bradycardic.  Pain reproducible along the chest and epigastrium so she was given a GI cocktail which did not provide relief.  Lab work  is all reassuring.  Reassuring troponins, chest x-ray, as well as ECG.  Doubt ACS at this time.  No electrolyte derangements.  TSH within normal limits.  Does not appear fluid  overloaded.  Patient had a normal echocardiogram in October of last year.  Patient wore a cardiac monitor for 1 week that was removed about 1 week ago.  She has not received these results as of yet.  Recommended that she follow-up with cardiology to discuss further.  Return to the emergency department if her symptoms worsen.  Feel that she is stable for discharge at this time and she is agreeable.  Her questions were answered and she was amicable at the time of discharge.  Note: Portions of this report may have been transcribed using voice recognition software. Every effort was made to ensure accuracy; however, inadvertent computerized transcription errors may be present.   Final Clinical Impression(s) / ED Diagnoses Final diagnoses:  Palpitations   Rx / DC Orders ED Discharge Orders     None        Placido Sou, PA-C 08/25/20 [id number]    Cheryll Cockayne, MD 08/25/20 1059

## 2020-08-25 NOTE — ED Triage Notes (Signed)
Pt presents to ED with c/o heart palpitations x past few months. Reports being seen by cardiologist for same and given medication for blood pressure and heart rate. Also reports positive COVID test on Sunday.

## 2020-08-25 NOTE — Discharge Instructions (Addendum)
Please continue to monitor your symptoms closely.  If they worsen, please come back to the emergency department.  Otherwise, please follow-up with your cardiologist as well as your regular doctor.  It was a pleasure to meet you.

## 2020-12-28 IMAGING — CT CT ABD-PELV W/O CM
2 of 4 series · 16 of 46 positions shown, 18 images · non-contrast
Comparison: Lumbar spine MRI dated 11/03/2019

CLINICAL DATA: Right flank pain extending into the groin and right
leg the past 6 months. Microscopic hematuria.

EXAM:
CT ABDOMEN AND PELVIS WITHOUT CONTRAST
TECHNIQUE: Multidetector CT imaging of the abdomen and pelvis was performed
following the standard protocol without IV contrast.

[Series 2: renal stone 5.00 br40 s3 axial · axial · 0.81mm/px · z∈[+1233,+1613]mm · 13 of 86 slices shown, 15 images]
[im 5/86  soft-tissue]
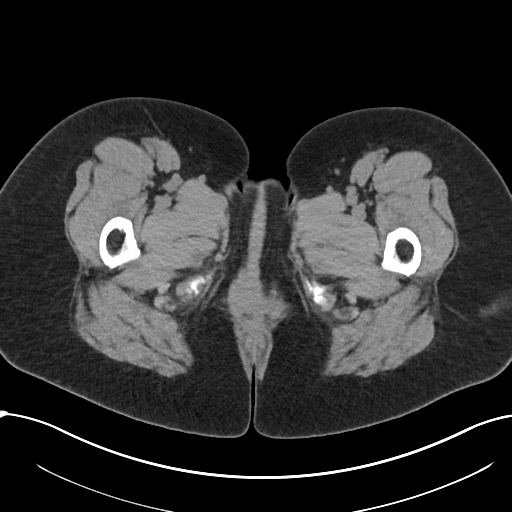
[im 5/86  bone]
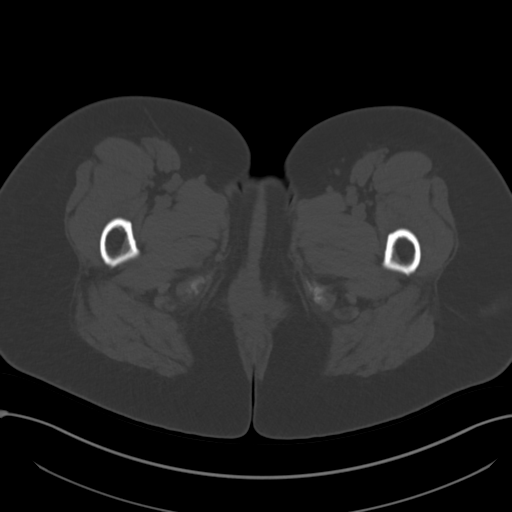
[im 13/86  soft-tissue]
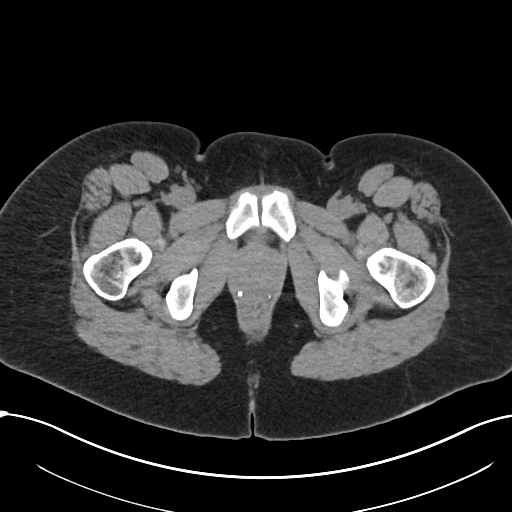
[im 17/86  soft-tissue]
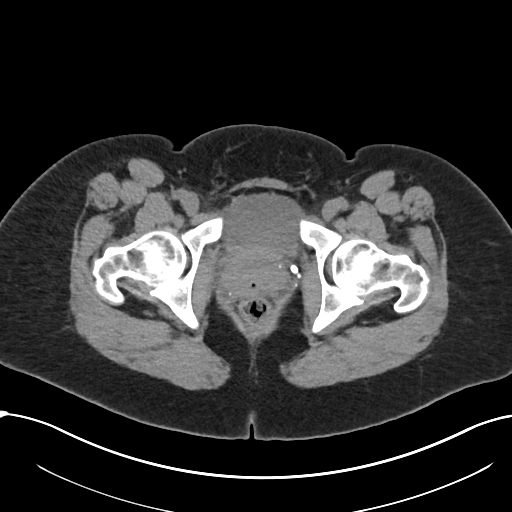
[im 25/86  soft-tissue]
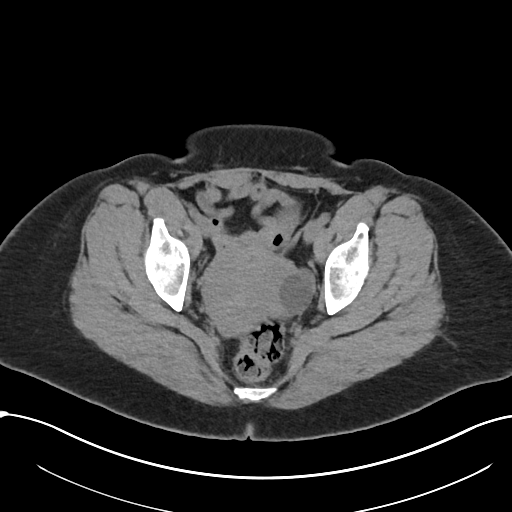
[im 29/86  soft-tissue]
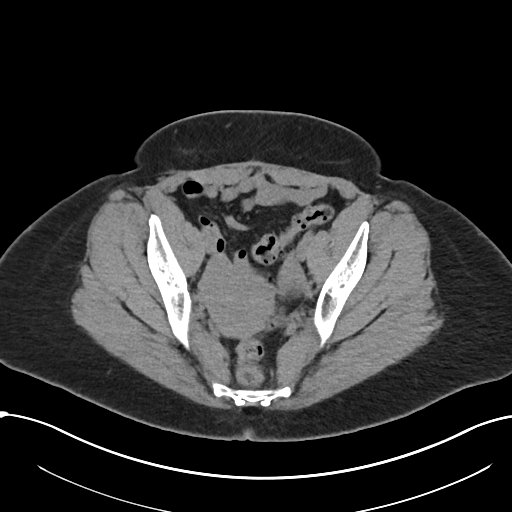
[im 37/86  soft-tissue]
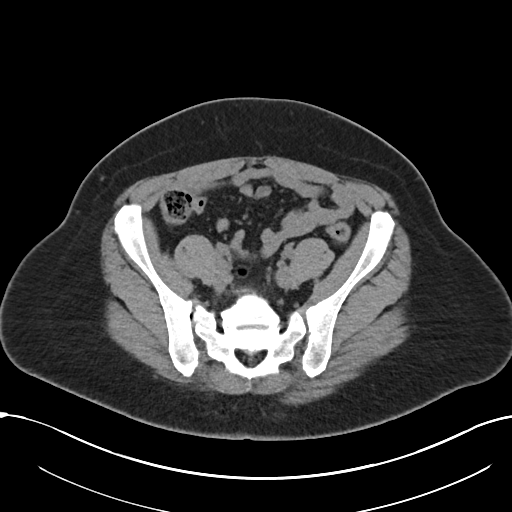
[im 45/86  soft-tissue]
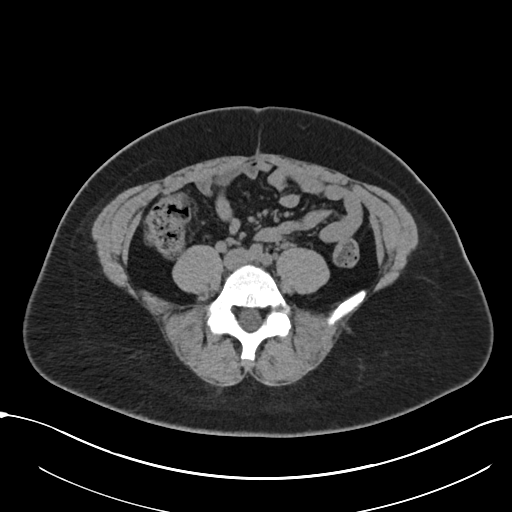
[im 49/86  soft-tissue]
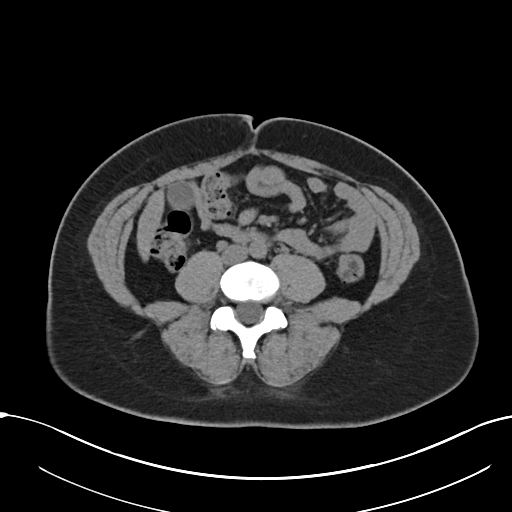
[im 57/86  soft-tissue]
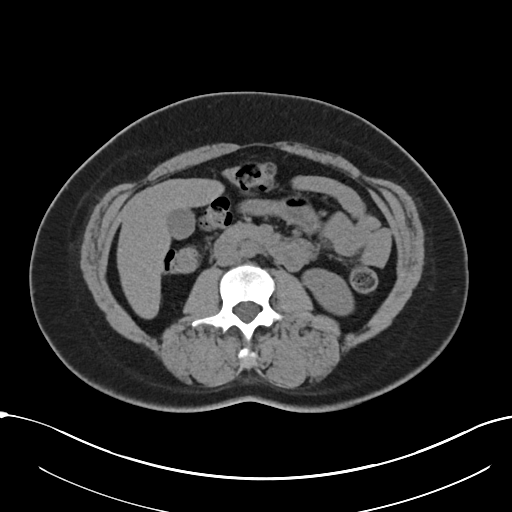
[im 57/86  bone]
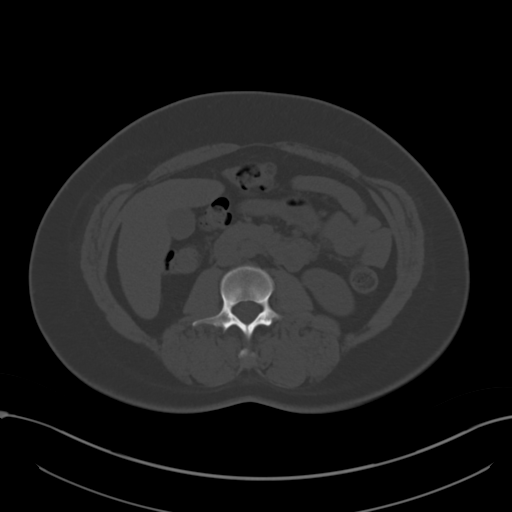
[im 61/86  soft-tissue]
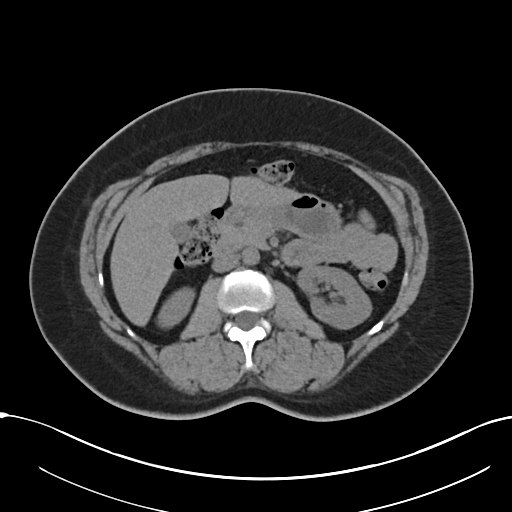
[im 69/86  soft-tissue]
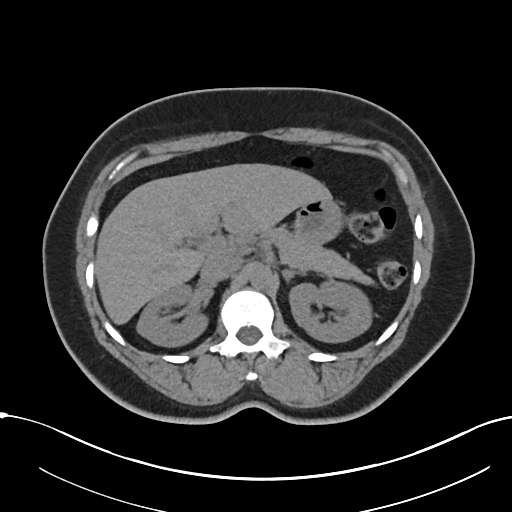
[im 73/86  soft-tissue]
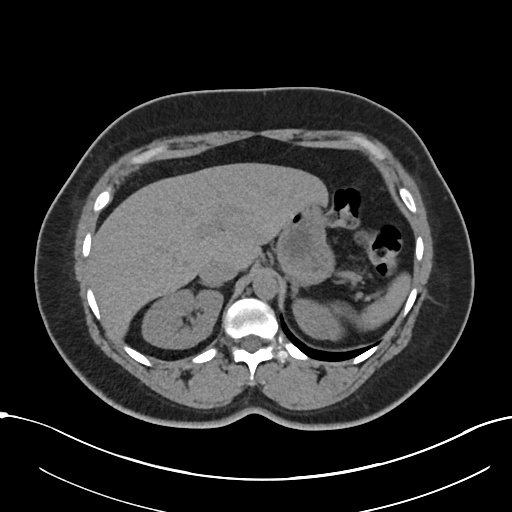
[im 81/86  soft-tissue]
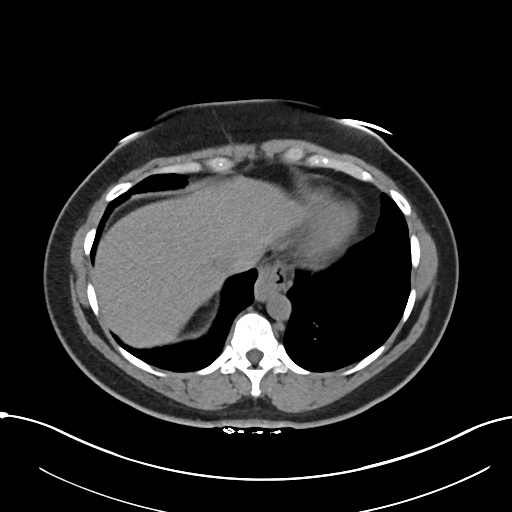

[Series 6: renal stone 2.00 br40 s3 cor · coronal · 0.81mm/px · 3 of 171 slices shown]
[im 57/171  soft-tissue]
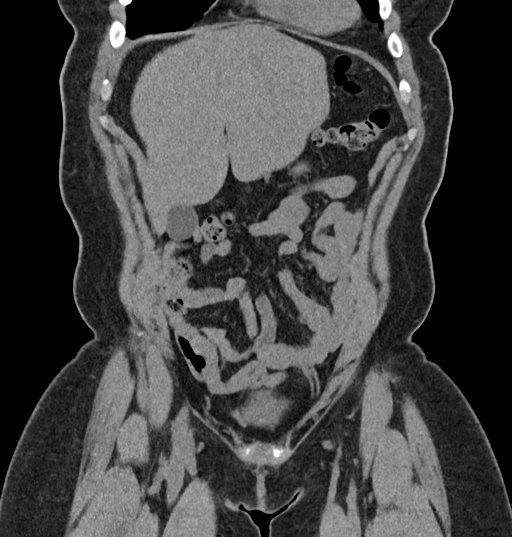
[im 76/171  soft-tissue]
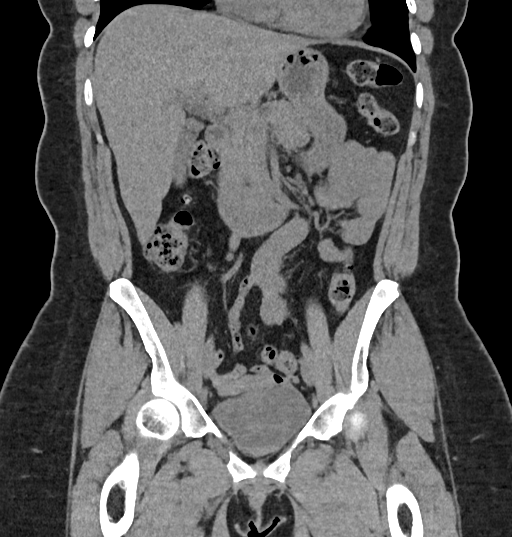
[im 95/171  soft-tissue]
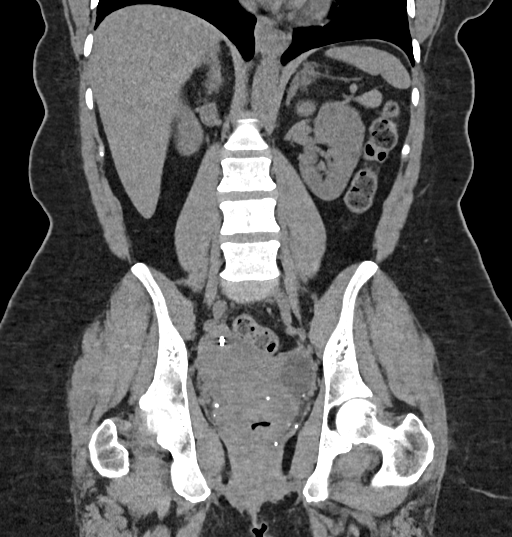

[16 of 46 positions shown; findings below may reference images not displayed]

FINDINGS: Lower chest: Borderline enlarged heart. Clear lung bases.

Hepatobiliary: No focal liver abnormality is seen. No gallstones,
gallbladder wall thickening, or biliary dilatation.

Pancreas: Unremarkable. No pancreatic ductal dilatation or
surrounding inflammatory changes.

Spleen: Normal in size without focal abnormality.

Adrenals/Urinary Tract: Adrenal glands are unremarkable. Kidneys are
normal, without renal calculi, focal lesion, or hydronephrosis.
Bladder is unremarkable.

Stomach/Bowel: Small hiatal hernia. Normal appearing small bowel,
appendix and colon.

Vascular/Lymphatic: No significant vascular findings are present. No
enlarged abdominal or pelvic lymph nodes.

Reproductive: Mildly enlarged and heterogeneous uterus. Bilateral
tubal ligation clips. 3.0 cm simple appearing left ovarian
cyst/follicle. Unremarkable right ovary.

Other: No abdominal wall hernia or abnormality. No abdominopelvic
ascites.

Musculoskeletal: Minimal lumbar and lower thoracic spine
degenerative changes.
IMPRESSION: 1. No acute abnormality.
2. No urinary tract calculi or other abnormalities seen.
3. Small hiatal hernia.
4. 3.0 cm simple appearing left ovarian cyst/follicle. This is
almost certainly benign, and no specific imaging follow up is
recommended according to the Society of Radiologists in Ultrasound
0020 consensus Conference Statement (Shufan Pamplona et al. Management of
Asymptomatic Ovarian and Other Adnexal Cysts Imaged at US: Society
of Radiologists in Ultrasound Consensus Conference Statement 0020.
Radiology [DATE]): [id number].).

## 2021-08-05 ENCOUNTER — Emergency Department (HOSPITAL_COMMUNITY)
Admission: EM | Admit: 2021-08-05 | Discharge: 2021-08-05 | Discharge status: Against Medical Advice | Payer: Medicaid Other | Attending: Emergency Medicine | Admitting: Emergency Medicine

## 2021-08-05 ENCOUNTER — Other Ambulatory Visit: Payer: Self-pay

## 2021-08-05 ENCOUNTER — Encounter (HOSPITAL_COMMUNITY): Payer: Self-pay

## 2021-08-05 ENCOUNTER — Emergency Department (HOSPITAL_COMMUNITY): Payer: Medicaid Other

## 2021-08-05 ENCOUNTER — Emergency Department (HOSPITAL_BASED_OUTPATIENT_CLINIC_OR_DEPARTMENT_OTHER): Payer: Medicaid Other

## 2021-08-05 DIAGNOSIS — Z5321 Procedure and treatment not carried out due to patient leaving prior to being seen by health care provider: Secondary | ICD-10-CM | POA: Diagnosis not present

## 2021-08-05 DIAGNOSIS — M7989 Other specified soft tissue disorders: Secondary | ICD-10-CM | POA: Insufficient documentation

## 2021-08-05 DIAGNOSIS — M79601 Pain in right arm: Secondary | ICD-10-CM | POA: Diagnosis not present

## 2021-08-05 DIAGNOSIS — R200 Anesthesia of skin: Secondary | ICD-10-CM | POA: Insufficient documentation

## 2021-08-05 DIAGNOSIS — R202 Paresthesia of skin: Secondary | ICD-10-CM
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
## 2021-08-05 NOTE — ED Triage Notes (Signed)
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

## 2021-08-05 NOTE — Progress Notes (Signed)
Upper extremity venous RT study completed.  Preliminary results relayed to [place], [place].  See CV Proc for preliminary results report.   Jean Rosenthal, RDMS, RVT

## 2021-08-11 ENCOUNTER — Encounter (HOSPITAL_COMMUNITY): Payer: Self-pay | Admitting: Emergency Medicine

## 2021-08-11 ENCOUNTER — Ambulatory Visit (HOSPITAL_COMMUNITY)
Admission: EM | Admit: 2021-08-11 | Discharge: 2021-08-11 | Disposition: A | Discharge status: Home / Self Care | Payer: Medicaid Other | Attending: Student | Admitting: Student

## 2021-08-11 ENCOUNTER — Other Ambulatory Visit: Payer: Self-pay

## 2021-08-11 DIAGNOSIS — H01001 Unspecified blepharitis right upper eyelid: Secondary | ICD-10-CM

## 2021-08-11 DIAGNOSIS — H01004 Unspecified blepharitis left upper eyelid: Secondary | ICD-10-CM

## 2021-08-11 MED ORDER — NEOMYCIN-POLYMYXIN-DEXAMETH 3.5-10000-0.1 OP OINT: 1.0000 | TOPICAL_OINTMENT | Freq: Every day | OPHTHALMIC | 0 refills | Status: AC

## 2021-08-11 NOTE — Discharge Instructions (Signed)
-  Neomycin-polymyxin b-dexamethasone ointment at bedtime x7 days. Pull down the lower eyelid, and place about half an inch inside.  This will be messy, so press the remaining ointment around the eye.  You can wash your face with gentle soap and water in the morning to wash off any remaining ointment. -You can continue the allergy medications and drops during the day  -Warm compresses -Seek additional medical attention if symptoms get worse, like eye pain, eye lid swelling, vision changes. Follow-up with your eye doctor if possible, but we're also happy to see you! Call your eye doctor Monday 7/17 if your symptoms worsen or persist.  -Don't wear contacts until symptoms resolve

## 2021-08-11 NOTE — ED Provider Notes (Signed)
[facility]    CSN: [id number] Arrival date & time: 08/11/21  1632      History   Chief Complaint Chief Complaint  Patient presents with   Eye Problem    HPI Armine Rizzolo is a 51 y.o. female describes bilateral eye itching and watering for 1 day.  History dry eye, uses Restasis as needed for this, followed by ophthalmology.  She states the eyes are watery, itchy, uncomfortable.  Has tried various over-the-counter remedies including Benadryl, Restasis eyedrops without relief.  Denies photophobia, foreign body sensation, eye crusting in the morning, eye pain, eye pain with movement, injury to eye, vision changes, vision loss, flashes of light or floaters in field of vision, double vision. Has not followed with her eye doctor for this yet.  She has not worn contact lenses in 3 months.  HPI  Past Medical History:  Diagnosis Date   Allergies    Per patient   Asthma    Per patient    Patient Active Problem List   Diagnosis Date Noted   Elevated sedimentation rate 01/12/2020   Pain in right ankle and joints of right foot 01/12/2020   Polyarthralgia 01/12/2020   Rash and other nonspecific skin eruption 01/12/2020    Past Surgical History:  Procedure Laterality Date   CESAREAN SECTION  2005   Per patient   PARTIAL HYSTERECTOMY     Per patient   UMBILICAL HERNIA REPAIR     Per patient     OB History   No obstetric history on file.      Home Medications    Prior to Admission medications   Medication Sig Start Date End Date Taking? Authorizing Provider  neomycin-polymyxin b-dexamethasone (MAXITROL) 3.5-10000-0.1 OINT Place 1 Application into both eyes at bedtime for 7 days. 08/11/21 08/18/21 Yes Rhys Martini, PA-C  albuterol (VENTOLIN HFA) 108 (90 Base) MCG/ACT inhaler Inhale 1-2 puffs into the lungs every 6 (six) hours as needed for wheezing or shortness of breath.    [provider]  cetirizine (ZYRTEC) 10 MG tablet Take 10 mg by mouth daily.  09/06/19   [provider]  furosemide (LASIX) 20 MG tablet Take 1 tablet (20 mg total) by mouth as needed (weight gain of 3 lbs overnight or 5 lbs in 1 week). 09/30/19   Little Ishikawa, MD  ibuprofen (ADVIL) 800 MG tablet Take 800 mg by mouth 3 (three) times daily as needed. 08/27/19   [provider]  Olopatadine HCl 0.2 % SOLN Apply 1 drop to eye every morning. 06/02/19   [provider]  omeprazole (PRILOSEC) 20 MG capsule Take 20 mg by mouth daily. 01/11/20   [provider]  pantoprazole (PROTONIX) 40 MG tablet Take 40 mg by mouth daily. 10/26/19   [provider]  topiramate (TOPAMAX) 50 MG tablet Take 50 mg by mouth as needed. 11/02/19   [provider]    Family History History reviewed. No pertinent family history.  Social History Social History   Tobacco Use   Smoking status: Never   Smokeless tobacco: Never  Vaping Use   Vaping Use: Never used  Substance Use Topics   Alcohol use: Not Currently   Drug use: Not Currently     Allergies   Almond oil, Oxycodone-acetaminophen, Codeine, Flagyl [metronidazole], and Promethazine   Review of Systems Review of Systems  Eyes:  Positive for itching.  All other systems reviewed and are negative.    Physical Exam Triage Vital Signs ED  Triage Vitals  Enc Vitals Group     BP 08/11/21 1705 140/90     Pulse Rate 08/11/21 1705 82     Resp 08/11/21 1705 20     Temp 08/11/21 1705 98.4 F (36.9 C)     Temp Source 08/11/21 1705 Oral     SpO2 08/11/21 1705 98 %     Weight --      Height --      Head Circumference --      Peak Flow --      Pain Score 08/11/21 1703 0     Pain Loc --      Pain Edu? --      Excl. in GC? --    No data found.  Updated Vital Signs BP 140/90 (BP Location: Left Arm) Comment (BP Location): large cuff  Pulse 82   Temp 98.4 F (36.9 C) (Oral)   Resp 20   SpO2 98%   Visual Acuity Right Eye Distance:   Left Eye Distance:   Bilateral  Distance:    Right Eye Near:   Left Eye Near:    Bilateral Near:     Physical Exam Vitals reviewed.  Constitutional:      Appearance: Normal appearance.  HENT:     Head: Normocephalic and atraumatic.     Right Ear: Tympanic membrane, ear canal and external ear normal. There is no impacted cerumen.     Left Ear: Tympanic membrane, ear canal and external ear normal. There is no impacted cerumen.     Nose: Nose normal. No congestion.     Mouth/Throat:     Pharynx: Oropharynx is clear. No posterior oropharyngeal erythema.  Eyes:     General: Lids are normal. Lids are everted, no foreign bodies appreciated. Vision grossly intact. Gaze aligned appropriately. No visual field deficit.       Right eye: No foreign body, discharge or hordeolum.        Left eye: No foreign body, discharge or hordeolum.     Extraocular Movements: Extraocular movements intact.     Right eye: Normal extraocular motion and no nystagmus.     Left eye: Normal extraocular motion and no nystagmus.     Conjunctiva/sclera:     Right eye: Right conjunctiva is injected. No chemosis, exudate or hemorrhage.    Left eye: Left conjunctiva is injected. No chemosis, exudate or hemorrhage.    Pupils: Pupils are equal, round, and reactive to light.     Visual Fields: Right eye visual fields normal and left eye visual fields normal.     Comments: Mild conjunctival injection bilaterally, with watering.  Lids are not swollen, but are mildly tender to palpation along lash line.  PERRLA, EOMI without pain. No orbital tenderness.   Cardiovascular:     Rate and Rhythm: Normal rate and regular rhythm.     Heart sounds: Normal heart sounds.  Pulmonary:     Effort: Pulmonary effort is normal.     Breath sounds: Normal breath sounds.  Neurological:     General: No focal deficit present.     Mental Status: She is alert.  Psychiatric:        Mood and Affect: Mood normal.        Behavior: Behavior normal.        Thought Content: Thought  content normal.        Judgment: Judgment normal.      UC Treatments / Results  Labs (all labs ordered are listed,  but only abnormal results are displayed) Labs Reviewed - No data to display  EKG   Radiology No results found.  Procedures Procedures (including critical care time)  Medications Ordered in UC Medications - No data to display  Initial Impression / Assessment and Plan / UC Course  I have reviewed the triage vital signs and the nursing notes.  Pertinent labs & imaging results that were available during my care of the patient were reviewed by me and considered in my medical decision making (see chart for details).     This patient is a very pleasant 51 y.o. year old female presenting with blepharitis x1-2 days. Visual acuity intact, forgot her glasses today. She has not worn contacts x3 months, and will not wear them until symptoms resolve.  She can continue her allergy medication and Restasis drops as needed, and sent neomycin-polymyxin B ointment for bedtime.  She is already followed by ophthalmology, so she will call them next business day if symptoms persist, or seek immediate care if symptoms worsen.  Final Clinical Impressions(s) / UC Diagnoses   Final diagnoses:  Blepharitis of upper eyelids of both eyes, unspecified type     Discharge Instructions      -Neomycin-polymyxin b-dexamethasone ointment at bedtime x7 days. Pull down the lower eyelid, and place about half an inch inside.  This will be messy, so press the remaining ointment around the eye.  You can wash your face with gentle soap and water in the morning to wash off any remaining ointment. -You can continue the allergy medications and drops during the day  -Warm compresses -Seek additional medical attention if symptoms get worse, like eye pain, eye lid swelling, vision changes. Follow-up with your eye doctor if possible, but we're also happy to see you! Call your eye doctor Monday 7/17 if your  symptoms worsen or persist.  -Don't wear contacts until symptoms resolve     ED Prescriptions     Medication Sig Dispense Auth. Provider   neomycin-polymyxin b-dexamethasone (MAXITROL) 3.5-10000-0.1 OINT Place 1 Application into both eyes at bedtime for 7 days. 3.5 g Rhys Martini, PA-C      PDMP not reviewed this encounter.   Rhys Martini, PA-C 08/11/21 1726

## 2021-08-11 NOTE — ED Triage Notes (Signed)
On Friday, both eyes were itching.  There has been slight watering of eyes.  Both eyes are red.  Denies runny nose or cough.  Patient has taken cetirizine.  Patient took benadryl 2 hours ago, and restasis eye drops-but nothing is making a difference

## 2021-09-15 ENCOUNTER — Emergency Department (HOSPITAL_COMMUNITY)
Admission: EM | Admit: 2021-09-15 | Discharge: 2021-09-16 | Disposition: A | Discharge status: Home / Self Care | Payer: Medicaid Other | Attending: Emergency Medicine | Admitting: Emergency Medicine

## 2021-09-15 ENCOUNTER — Other Ambulatory Visit: Payer: Self-pay

## 2021-09-15 ENCOUNTER — Encounter (HOSPITAL_COMMUNITY): Payer: Self-pay | Admitting: Emergency Medicine

## 2021-09-15 DIAGNOSIS — N9489 Other specified conditions associated with female genital organs and menstrual cycle: Secondary | ICD-10-CM | POA: Insufficient documentation

## 2021-09-15 DIAGNOSIS — R072 Precordial pain: Secondary | ICD-10-CM

## 2021-09-15 DIAGNOSIS — R0602 Shortness of breath: Secondary | ICD-10-CM | POA: Insufficient documentation

## 2021-09-15 DIAGNOSIS — J45909 Unspecified asthma, uncomplicated: Secondary | ICD-10-CM | POA: Diagnosis not present

## 2021-09-15 NOTE — ED Triage Notes (Signed)
Patient reports central chest pain yesterday with SOB and bilateral lower legs swelling/edema . No emesis or diaphoresis .

## 2021-09-16 ENCOUNTER — Emergency Department (HOSPITAL_COMMUNITY): Payer: Medicaid Other

## 2021-09-16 LAB — BASIC METABOLIC PANEL
Anion gap: 8 (ref 5–15)
BUN: 6 mg/dL (ref 6–20)
CO2: 25 mmol/L (ref 22–32)
Calcium: 9 mg/dL (ref 8.9–10.3)
Chloride: 106 mmol/L (ref 98–111)
Creatinine, Ser: 0.85 mg/dL (ref 0.44–1.00)
GFR, Estimated: >60 mL/min (ref >60)
Glucose, Bld: 109 mg/dL — ABNORMAL HIGH (ref 70–99)
Potassium: 4.1 mmol/L (ref 3.5–5.1)
Sodium: 139 mmol/L (ref 135–145)

## 2021-09-16 LAB — CBC
HCT: 38.5 % (ref 36.0–46.0)
Hemoglobin: 12.5 g/dL (ref 12.0–15.0)
MCH: 27.6 pg (ref 26.0–34.0)
MCHC: 32.5 g/dL (ref 30.0–36.0)
MCV: 85 fL (ref 80.0–100.0)
Platelets: 335 10*3/uL (ref 150–400)
RBC: 4.53 MIL/uL (ref 3.87–5.11)
RDW: 12.5 % (ref 11.5–15.5)
WBC: 5.7 10*3/uL (ref 4.0–10.5)
nRBC: 0 % (ref 0.0–0.2)

## 2021-09-16 LAB — PROTIME-INR
INR: 0.9 (ref 0.8–1.2)
Prothrombin Time: 12.2 seconds (ref 11.4–15.2)

## 2021-09-16 LAB — TROPONIN I (HIGH SENSITIVITY)
Troponin I (High Sensitivity): 3 ng/L (ref <18)
Troponin I (High Sensitivity): 4 ng/L (ref <18)

## 2021-09-16 LAB — I-STAT BETA HCG BLOOD, ED (MC, WL, AP ONLY): I-stat hCG, quantitative: <5 m[IU]/mL (ref <5)

## 2021-09-16 MED ORDER — ALUM & MAG HYDROXIDE-SIMETH 200-200-20 MG/5ML PO SUSP
30.0000 mL | Freq: Once | ORAL | Status: AC
  Administered 2021-09-16: 30 mL via ORAL
  Filled 2021-09-16: qty 30

## 2021-09-16 MED ORDER — PANTOPRAZOLE SODIUM 40 MG PO TBEC: 40.0000 mg | DELAYED_RELEASE_TABLET | Freq: Every day | ORAL | 0 refills | Status: DC

## 2021-09-16 MED ORDER — FAMOTIDINE 20 MG PO TABS
20.0000 mg | ORAL_TABLET | Freq: Once | ORAL | Status: AC
  Administered 2021-09-16: 20 mg via ORAL
  Filled 2021-09-16: qty 1

## 2021-09-16 NOTE — ED Provider Notes (Signed)
[facility] EMERGENCY DEPARTMENT Provider Note   CSN: [id number] Arrival date & time: 09/15/21  2333     History  Chief Complaint  Patient presents with   Chest Pain    Gina Mendez is a 51 y.o. female.  The history is provided by the patient.  Chest Pain Pain location:  Substernal area Pain radiates to:  Does not radiate Pain severity:  Moderate Duration:  1 day Timing:  Constant Progression:  Unchanged Chronicity:  Recurrent (ongoing for 2 years) Relieved by:  Nothing Worsened by:  Nothing Ineffective treatments:  None tried Associated symptoms: shortness of breath   Associated symptoms: no back pain, no fever, no palpitations, no syncope and no vomiting   Associated symptoms comment:  Dependent edema  Risk factors: no prior DVT/PE   Patient presents with 2 years of chest pain.  No travel. No OCP.      Past Medical History:  Diagnosis Date   Allergies    Per patient   Asthma    Per patient    Home Medications Prior to Admission medications   Medication Sig Start Date End Date Taking? Authorizing Provider  albuterol (VENTOLIN HFA) 108 (90 Base) MCG/ACT inhaler Inhale 1-2 puffs into the lungs every 6 (six) hours as needed for wheezing or shortness of breath.    [provider]  cetirizine (ZYRTEC) 10 MG tablet Take 10 mg by mouth daily. 09/06/19   [provider]  furosemide (LASIX) 20 MG tablet Take 1 tablet (20 mg total) by mouth as needed (weight gain of 3 lbs overnight or 5 lbs in 1 week). 09/30/19   Little Ishikawa, MD  ibuprofen (ADVIL) 800 MG tablet Take 800 mg by mouth 3 (three) times daily as needed. 08/27/19   [provider]  Olopatadine HCl 0.2 % SOLN Apply 1 drop to eye every morning. 06/02/19   [provider]  omeprazole (PRILOSEC) 20 MG capsule Take 20 mg by mouth daily. 01/11/20   [provider]  pantoprazole (PROTONIX) 40 MG tablet Take 40 mg by mouth daily. 10/26/19   [provider]  topiramate (TOPAMAX) 50 MG tablet Take 50 mg by mouth as needed. 11/02/19   [provider]      Allergies    Almond oil, Oxycodone-acetaminophen, Codeine, Flagyl [metronidazole], and Promethazine    Review of Systems   Review of Systems  Constitutional:  Negative for fever.  HENT:  Negative for facial swelling.   Eyes:  Negative for redness.  Respiratory:  Positive for shortness of breath.   Cardiovascular:  Positive for chest pain. Negative for palpitations and syncope.  Gastrointestinal:  Negative for vomiting.  Musculoskeletal:  Negative for back pain.  Neurological:  Negative for facial asymmetry.  Psychiatric/Behavioral:  Negative for agitation.   All other systems reviewed and are negative.   Physical Exam Updated Vital Signs BP 139/85 (BP Location: Right Arm)   Pulse 79   Temp 98.6 F (37 C) (Oral)   Resp 19   SpO2 100%  Physical Exam Vitals and nursing note reviewed.  Constitutional:      General: She is not in acute distress.    Appearance: Normal appearance. She is well-developed.  HENT:     Head: Normocephalic and atraumatic.     Nose: Nose normal.  Eyes:     Pupils: Pupils are equal, round, and reactive to light.  Cardiovascular:     Rate and Rhythm: Normal rate and regular rhythm.  Pulses: Normal pulses.     Heart sounds: Normal heart sounds.  Pulmonary:     Effort: Pulmonary effort is normal. No respiratory distress.     Breath sounds: Normal breath sounds.  Abdominal:     General: Abdomen is flat. Bowel sounds are normal. There is no distension.     Palpations: Abdomen is soft.     Tenderness: There is no abdominal tenderness. There is no guarding or rebound.  Genitourinary:    Vagina: No vaginal discharge.  Musculoskeletal:        General: Normal range of motion.     Cervical back: Neck supple.     Comments: No pitting edema   Skin:    General: Skin is warm and dry.     Capillary Refill: Capillary refill takes less  than 2 seconds.     Findings: No erythema or rash.  Neurological:     General: No focal deficit present.     Mental Status: She is alert.     Deep Tendon Reflexes: Reflexes normal.  Psychiatric:        Mood and Affect: Mood normal.     ED Results / Procedures / Treatments   Labs (all labs ordered are listed, but only abnormal results are displayed) Results for orders placed or performed during the hospital encounter of 09/15/21  Basic metabolic panel  Result Value Ref Range   Sodium 139 135 - 145 mmol/L   Potassium 4.1 3.5 - 5.1 mmol/L   Chloride 106 98 - 111 mmol/L   CO2 25 22 - 32 mmol/L   Glucose, Bld 109 (H) 70 - 99 mg/dL   BUN 6 6 - 20 mg/dL   Creatinine, Ser 8.41 0.44 - 1.00 mg/dL   Calcium 9.0 8.9 - 66.0 mg/dL   GFR, Estimated >63 >01 mL/min   Anion gap 8 5 - 15  CBC  Result Value Ref Range   WBC 5.7 4.0 - 10.5 K/uL   RBC 4.53 3.87 - 5.11 MIL/uL   Hemoglobin 12.5 12.0 - 15.0 g/dL   HCT 60.1 09.3 - 23.5 %   MCV 85.0 80.0 - 100.0 fL   MCH 27.6 26.0 - 34.0 pg   MCHC 32.5 30.0 - 36.0 g/dL   RDW 57.3 22.0 - 25.4 %   Platelets 335 150 - 400 K/uL   nRBC 0.0 0.0 - 0.2 %  Protime-INR (order if Patient is taking Coumadin / Warfarin)  Result Value Ref Range   Prothrombin Time 12.2 11.4 - 15.2 seconds   INR 0.9 0.8 - 1.2  I-Stat beta hCG blood, ED  Result Value Ref Range   I-stat hCG, quantitative <5.0 <5 mIU/mL   Comment 3          Troponin I (High Sensitivity)  Result Value Ref Range   Troponin I (High Sensitivity) 4 <18 ng/L   DG Chest 2 View  Result Date: 09/16/2021 CLINICAL DATA:  Chest pain EXAM: CHEST - 2 VIEW COMPARISON:  08/25/2020 FINDINGS: The heart size and mediastinal contours are within normal limits. Both lungs are clear. The visualized skeletal structures are unremarkable. IMPRESSION: Negative. Electronically Signed   By: Charlett Nose M.D.   On: 09/16/2021 00:13     EKG EKG Interpretation  Date/Time:  Saturday September 15 2021 23:46:06  EDT Ventricular Rate:  83 PR Interval:  160 QRS Duration: 72 QT Interval:  398 QTC Calculation: 467 R Axis:   45 Text Interpretation: Normal sinus rhythm Confirmed by Nicanor Alcon, Zaquan Duffner ([id number]) on  09/16/2021 1:20:50 AM  Radiology DG Chest 2 View  Result Date: 09/16/2021 CLINICAL DATA:  Chest pain EXAM: CHEST - 2 VIEW COMPARISON:  08/25/2020 FINDINGS: The heart size and mediastinal contours are within normal limits. Both lungs are clear. The visualized skeletal structures are unremarkable. IMPRESSION: Negative. Electronically Signed   By: Charlett Nose M.D.   On: 09/16/2021 00:13    Procedures Procedures    Medications Ordered in ED Medications - No data to display  ED Course/ Medical Decision Making/ A&P                           Medical Decision Making Patient with 2 years of chest pain   Amount and/or Complexity of Data Reviewed External Data Reviewed: notes.    Details: previous notes reviewed Labs: ordered.    Details: all labs reviewed:  normal white count 5.7, normal hemoglobin 12.5, normal platelets 335k.  Normal coags.  troponin normal 4. negative pregnancy test.  Normal sodium 139, and potassium 4.1 and normal creatining .85 Radiology: ordered and independent interpretation performed.    Details: normal CXR by me ECG/medicine tests: ordered and independent interpretation performed. Decision-making details documented in ED Course.  Risk OTC drugs. Prescription drug management. Risk Details: PERC negative wells 0, highly doubt PE.  Ongoing pain in chest.  This is not cardiac in nature.  HEART score 1 very low risk for MACE.  Stable for discharge.  I have recommended compression stockings for dependent edema.  Follow up with your PMD for ongoing care.      Final Clinical Impression(s) / ED Diagnoses Final diagnoses:  None   Return for intractable cough, coughing up blood, fevers > 100.4 unrelieved by medication, shortness of breath, intractable vomiting, chest pain,  shortness of breath, weakness, numbness, changes in speech, facial asymmetry, abdominal pain, passing out, Inability to tolerate liquids or food, cough, altered mental status or any concerns. No signs of systemic illness or infection. The patient is nontoxic-appearing on exam and vital signs are within normal limits.  I have reviewed the triage vital signs and the nursing notes. Pertinent labs & imaging results that were available during my care of the patient were reviewed by me and considered in my medical decision making (see chart for details). After history, exam, and medical workup I feel the patient has been appropriately medically screened and is safe for discharge home. Pertinent diagnoses were discussed with the patient. Patient was given return precautions.  Rx / DC Orders ED Discharge Orders     None         Marque Bango, MD 09/16/21 [phone]

## 2021-10-04 IMAGING — DX DG CHEST 1V PORT
1 series · 1 of 1 positions shown · non-contrast
Comparison: Chest radiographs 09/23/2019.

CLINICAL DATA: 49-year-old female with palpitations. Positive for
[id number] 5 days ago.

EXAM:
PORTABLE CHEST 1 VIEW

[chest ap]
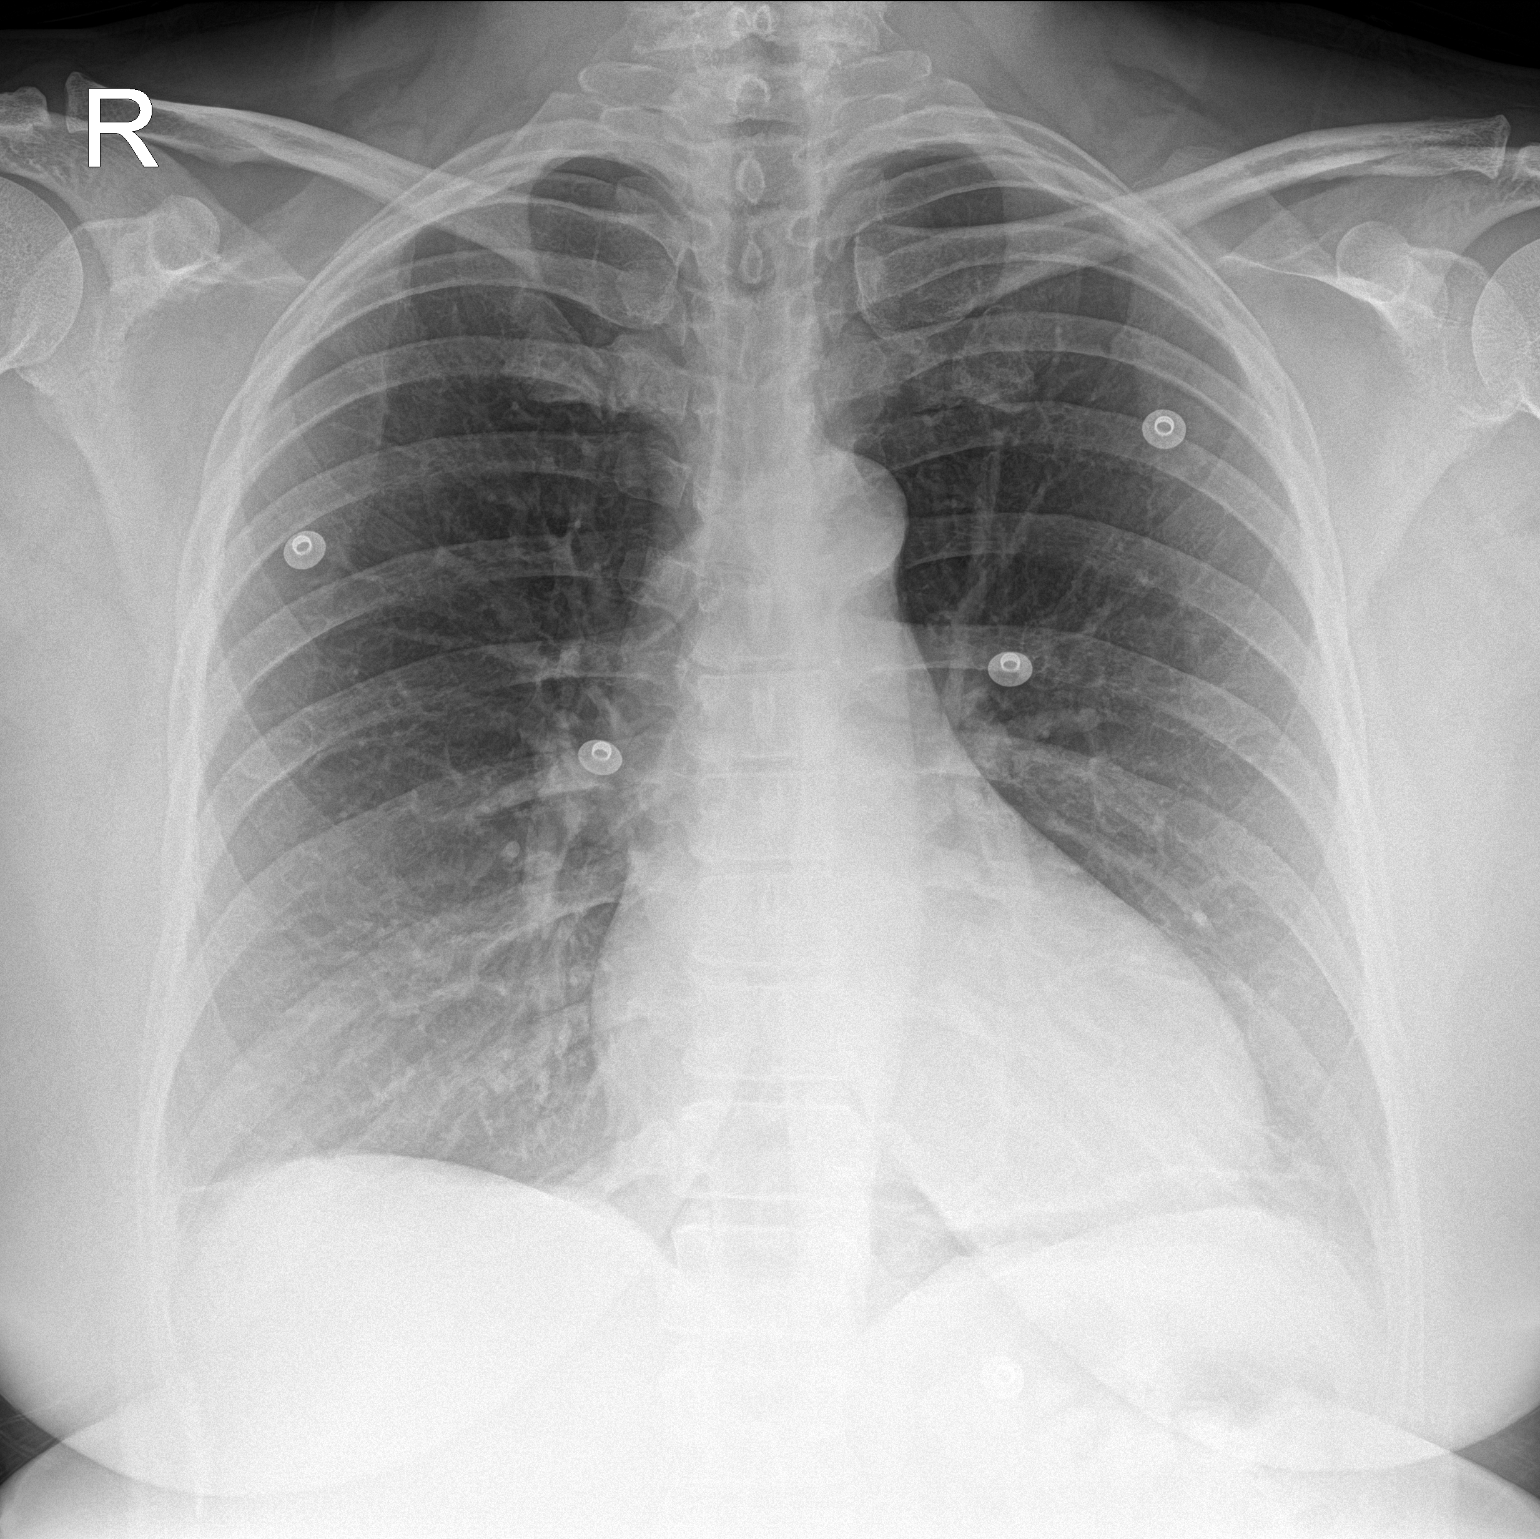

[1 of 1 positions shown; findings below may reference images not displayed]

FINDINGS: Portable AP view at 3521 hours. Normal lung volumes. Mediastinal
contours are stable, heart size at the upper limits of normal. Mild
chronic basilar atelectasis or scarring appears stable since last
year. Otherwise allowing for portable technique the lungs are clear.
No pneumothorax or pleural effusion identified. No acute osseous
abnormality identified. Negative visible bowel gas, osseous
structures.
IMPRESSION: Stable borderline cardiomegaly and mild basilar atelectasis or
scarring. No acute cardiopulmonary abnormality.

## 2022-03-09 ENCOUNTER — Ambulatory Visit
Admission: EM | Admit: 2022-03-09 | Discharge: 2022-03-09 | Disposition: A | Discharge status: Home / Self Care | Payer: Medicaid Other | Attending: Nurse Practitioner | Admitting: Nurse Practitioner

## 2022-03-09 DIAGNOSIS — N3090 Cystitis, unspecified without hematuria: Secondary | ICD-10-CM

## 2022-03-09 DIAGNOSIS — R300 Dysuria: Secondary | ICD-10-CM

## 2022-03-09 DIAGNOSIS — G8929 Other chronic pain: Secondary | ICD-10-CM

## 2022-03-09 DIAGNOSIS — M5441 Lumbago with sciatica, right side: Secondary | ICD-10-CM | POA: Diagnosis not present

## 2022-03-09 LAB — POCT URINALYSIS DIP (MANUAL ENTRY)
Bilirubin, UA: NEGATIVE
Glucose, UA: NEGATIVE mg/dL
Ketones, POC UA: NEGATIVE mg/dL
Leukocytes, UA: NEGATIVE
Nitrite, UA: NEGATIVE
Protein Ur, POC: NEGATIVE mg/dL
Spec Grav, UA: 1.025 (ref 1.010–1.025)
Urobilinogen, UA: 1 E.U./dL
pH, UA: 7 (ref 5.0–8.0)

## 2022-03-09 MED ORDER — PHENAZOPYRIDINE HCL 200 MG PO TABS: 200.0000 mg | ORAL_TABLET | Freq: Three times a day (TID) | ORAL | 2 refills | Status: DC

## 2022-03-09 MED ORDER — PREDNISONE 10 MG (21) PO TBPK: ORAL_TABLET | Freq: Every day | ORAL | 0 refills | Status: DC

## 2022-03-09 NOTE — ED Triage Notes (Signed)
Pt presents with c/o leg and foot pains with swelling. Pt states this has been going on for 1 week. States she feels needle stick pains. Reports she is unable to sleep and c/o urinary urgency and hematuria. Pt states states any movement causes leg, hip pains.

## 2022-03-09 NOTE — ED Provider Notes (Signed)
[facility]    CSN: [id number] Arrival date & time: 03/09/22  1029      History   Chief Complaint Chief Complaint  Patient presents with   Abdominal Pain   Hematuria   Urinary Frequency   Leg Pain    HPI Gina Mendez is a 52 y.o. female presents for evaluation of dysuria and back pain.  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  States she seen orthopedics in the past and did previously get steroid injections that initially helped but stopped working.  She denies any injury or known inciting event prior to onset of her back pain states has been going on for years.  Reports a chronic right lower back pain that she states is a 10 out of 10.  Denies any numbness/tingling/weakness of her lower extremities, no bowel or bladder incontinence, no saddle paresthesia.  No history of back surgeries in the past.  She has been taking ibuprofen, Tylenol, muscle relaxers with no improvement.  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  She has not seen orthopedist in some time.  In addition she reports intermittent urinary symptoms including burning, urgency, frequency that come and go and typically resolve on their own.  She does notice symptoms typically can be worse with certain foods such as carbonation or spicy foods.  States has been seen for this a few times by her PCP and consistently finds blood in the urine but they have no identifiable cause of her symptoms.  She has not seen urology.  No other concerns at this time.   Abdominal Pain Associated symptoms: hematuria   Hematuria  Urinary Frequency  Leg Pain Associated symptoms: back pain     Past Medical History:  Diagnosis Date   Allergies    Per patient   Asthma    Per patient    Patient Active Problem List   Diagnosis Date Noted   Elevated sedimentation rate 01/12/2020   Pain in right ankle and joints of right foot 01/12/2020   Polyarthralgia 01/12/2020   Rash  and other nonspecific skin eruption 01/12/2020    Past Surgical History:  Procedure Laterality Date   CESAREAN SECTION  2005   Per patient   PARTIAL HYSTERECTOMY     Per patient   UMBILICAL HERNIA REPAIR     Per patient     OB History   No obstetric history on file.      Home Medications    Prior to Admission medications   Medication Sig Start Date End Date Taking? Authorizing Provider  phenazopyridine (PYRIDIUM) 200 MG tablet Take 1 tablet (200 mg total) by mouth 3 (three) times daily. 03/09/22  Yes Melynda Ripple, NP  predniSONE (STERAPRED UNI-PAK 21 TAB) 10 MG (21) TBPK tablet Take by mouth daily. Take 6 tabs by mouth daily  for 2 days, then 5 tabs for 2 days, then 4 tabs for 2 days, then 3 tabs for 2 days, 2 tabs for 2 days, then 1 tab by mouth daily for 2 days 03/09/22  Yes Melynda Ripple, NP  albuterol (VENTOLIN HFA) 108 (90 Base) MCG/ACT inhaler Inhale 1-2 puffs into the lungs every 6 (six) hours as needed for wheezing or shortness of breath.    [provider]  cetirizine (ZYRTEC) 10 MG tablet Take 10 mg by mouth daily. 09/06/19   [provider]  furosemide (LASIX) 20 MG tablet Take 1 tablet (20 mg total) by  mouth as needed (weight gain of 3 lbs overnight or 5 lbs in 1 week). 09/30/19   Donato Heinz, MD  ibuprofen (ADVIL) 800 MG tablet Take 800 mg by mouth 3 (three) times daily as needed. 08/27/19   [provider]  Olopatadine HCl 0.2 % SOLN Apply 1 drop to eye every morning. 06/02/19   [provider]  omeprazole (PRILOSEC) 20 MG capsule Take 20 mg by mouth daily. 01/11/20   [provider]  pantoprazole (PROTONIX) 40 MG tablet Take 1 tablet (40 mg total) by mouth daily. 09/16/21   Palumbo, April, MD  topiramate (TOPAMAX) 50 MG tablet Take 50 mg by mouth as needed. 11/02/19   [provider]    Family History History reviewed. No pertinent family history.  Social History Social History   Tobacco Use   Smoking  status: Never   Smokeless tobacco: Never  Vaping Use   Vaping Use: Never used  Substance Use Topics   Alcohol use: Not Currently   Drug use: Not Currently     Allergies   Almond oil, Oxycodone-acetaminophen, Codeine, Flagyl [metronidazole], and Promethazine   Review of Systems Review of Systems  Genitourinary:  Positive for frequency and hematuria.  Musculoskeletal:  Positive for back pain.     Physical Exam Triage Vital Signs ED Triage Vitals  Enc Vitals Group     BP 03/09/22 1158 (!) 148/85     Pulse Rate 03/09/22 1158 60     Resp 03/09/22 1158 17     Temp 03/09/22 1158 98.5 F (36.9 C)     Temp Source 03/09/22 1158 Oral     SpO2 03/09/22 1158 99 %     Weight --      Height --      Head Circumference --      Peak Flow --      Pain Score 03/09/22 1157 10     Pain Loc --      Pain Edu? --      Excl. in [place]? --    No data found.  Updated Vital Signs BP (!) 148/85 (BP Location: Left Arm)   Pulse 60   Temp 98.5 F (36.9 C) (Oral)   Resp 17   SpO2 99%   Visual Acuity Right Eye Distance:   Left Eye Distance:   Bilateral Distance:    Right Eye Near:   Left Eye Near:    Bilateral Near:     Physical Exam Vitals and nursing note reviewed.  Constitutional:      Appearance: She is well-developed.  HENT:     Head: Normocephalic and atraumatic.  Eyes:     Pupils: Pupils are equal, round, and reactive to light.  Cardiovascular:     Rate and Rhythm: Normal rate.  Pulmonary:     Effort: Pulmonary effort is normal.  Abdominal:     Tenderness: There is no right CVA tenderness or left CVA tenderness.  Musculoskeletal:     Cervical back: Normal.     Thoracic back: Normal.     Lumbar back: No swelling, edema, deformity, signs of trauma, lacerations, spasms, tenderness or bony tenderness. Normal range of motion. Positive right straight leg raise test. Negative left straight leg raise test. No scoliosis.       Back:     Comments: I did advise right lower back  as area of chronic pain but denies tenderness to palpation.  Strength is 5 out of 5 bilateral lower extremities.  Skin:  General: Skin is warm and dry.  Neurological:     General: No focal deficit present.     Mental Status: She is alert.  Psychiatric:        Mood and Affect: Mood normal.        Behavior: Behavior normal.      UC Treatments / Results  Labs (all labs ordered are listed, but only abnormal results are displayed) Labs Reviewed  POCT URINALYSIS DIP (MANUAL ENTRY) - Abnormal; Notable for the following components:      Result Value   Clarity, UA cloudy (*)    Blood, UA small (*)    All other components within normal limits  URINE CULTURE    EKG   Radiology No results found.  Procedures Procedures (including critical care time)  Medications Ordered in UC Medications - No data to display  Initial Impression / Assessment and Plan / UC Course  I have reviewed the triage vital signs and the nursing notes.  Pertinent labs & imaging results that were available during my care of the patient were reviewed by me and considered in my medical decision making (see chart for details).     I reviewed exam and sx with patient.  Discussed likely interstitial cystitis as cause of urinary symptoms.  UA negative for UTI.  Will culture urine given symptoms of blood .  May use Pyridium as needed until seen by urology Discussed chronic low back pain with sciatica.  Unclear cause Will do prednisone taper and advised patient to follow-up either with her PCP or orthopedics for further evaluation of her chronic back pain.  There were no red flags on exam. Patient verbalized understanding of these instructions ER precautions reviewed and patient verbalized understanding noted.  Advised patient keep a food diary and to follow-up with urology for further evaluation of the symptoms. Final Clinical Impressions(s) / UC Diagnoses   Final diagnoses:  Dysuria  Cystitis  Chronic  right-sided low back pain with right-sided sciatica     Discharge Instructions      Start prednisone as prescribed for her back pain. Pyridium as needed for urinary symptoms.  Please note this medication will make your urine orange Keep a food diary and follow-up with a urologist for further evaluation of the symptoms I also recommend you follow-up with an orthopedist for further evaluation and treatment options of your chronic back pain Please go to the emergency room if you have any worsening symptoms    ED Prescriptions     Medication Sig Dispense Auth. Provider   phenazopyridine (PYRIDIUM) 200 MG tablet Take 1 tablet (200 mg total) by mouth 3 (three) times daily. 6 tablet Melynda Ripple, NP   predniSONE (STERAPRED UNI-PAK 21 TAB) 10 MG (21) TBPK tablet Take by mouth daily. Take 6 tabs by mouth daily  for 2 days, then 5 tabs for 2 days, then 4 tabs for 2 days, then 3 tabs for 2 days, 2 tabs for 2 days, then 1 tab by mouth daily for 2 days 42 tablet Melynda Ripple, NP      PDMP not reviewed this encounter.   Melynda Ripple, NP 03/09/22 1259

## 2022-03-09 NOTE — Discharge Instructions (Signed)
Start prednisone as prescribed for her back pain. Pyridium as needed for urinary symptoms.  Please note this medication will make your urine orange Keep a food diary and follow-up with a urologist for further evaluation of the symptoms I also recommend you follow-up with an orthopedist for further evaluation and treatment options of your chronic back pain Please go to the emergency room if you have any worsening symptoms

## 2022-03-10 LAB — URINE CULTURE: Culture: <10000 — AB

## 2022-03-21 ENCOUNTER — Emergency Department (HOSPITAL_COMMUNITY): Payer: Medicaid Other

## 2022-03-21 ENCOUNTER — Emergency Department (HOSPITAL_COMMUNITY)
Admission: EM | Admit: 2022-03-21 | Discharge: 2022-03-21 | Disposition: A | Discharge status: Home / Self Care | Payer: Medicaid Other | Attending: Emergency Medicine | Admitting: Emergency Medicine

## 2022-03-21 ENCOUNTER — Other Ambulatory Visit: Payer: Self-pay

## 2022-03-21 ENCOUNTER — Encounter (HOSPITAL_COMMUNITY): Payer: Self-pay

## 2022-03-21 DIAGNOSIS — R519 Headache, unspecified: Secondary | ICD-10-CM | POA: Diagnosis present

## 2022-03-21 DIAGNOSIS — J45909 Unspecified asthma, uncomplicated: Secondary | ICD-10-CM | POA: Diagnosis not present

## 2022-03-21 DIAGNOSIS — Z79899 Other long term (current) drug therapy: Secondary | ICD-10-CM | POA: Insufficient documentation

## 2022-03-21 DIAGNOSIS — I10 Essential (primary) hypertension: Secondary | ICD-10-CM | POA: Insufficient documentation

## 2022-03-21 LAB — BASIC METABOLIC PANEL
Anion gap: 9 (ref 5–15)
BUN: 11 mg/dL (ref 6–20)
CO2: 28 mmol/L (ref 22–32)
Calcium: 9.4 mg/dL (ref 8.9–10.3)
Chloride: 100 mmol/L (ref 98–111)
Creatinine, Ser: 0.79 mg/dL (ref 0.44–1.00)
GFR, Estimated: >60 mL/min (ref >60)
Glucose, Bld: 99 mg/dL (ref 70–99)
Potassium: 3.8 mmol/L (ref 3.5–5.1)
Sodium: 137 mmol/L (ref 135–145)

## 2022-03-21 LAB — CBC WITH DIFFERENTIAL/PLATELET
Abs Immature Granulocytes: 0.01 10*3/uL (ref 0.00–0.07)
Basophils Absolute: 0 10*3/uL (ref 0.0–0.1)
Basophils Relative: 1 %
Eosinophils Absolute: 0.1 10*3/uL (ref 0.0–0.5)
Eosinophils Relative: 2 %
HCT: 40.3 % (ref 36.0–46.0)
Hemoglobin: 12.8 g/dL (ref 12.0–15.0)
Immature Granulocytes: 0 %
Lymphocytes Relative: 55 %
Lymphs Abs: 2.8 10*3/uL (ref 0.7–4.0)
MCH: 27.1 pg (ref 26.0–34.0)
MCHC: 31.8 g/dL (ref 30.0–36.0)
MCV: 85.2 fL (ref 80.0–100.0)
Monocytes Absolute: 0.4 10*3/uL (ref 0.1–1.0)
Monocytes Relative: 9 %
Neutro Abs: 1.7 10*3/uL (ref 1.7–7.7)
Neutrophils Relative %: 33 %
Platelets: 360 10*3/uL (ref 150–400)
RBC: 4.73 MIL/uL (ref 3.87–5.11)
RDW: 12.5 % (ref 11.5–15.5)
WBC: 5.1 10*3/uL (ref 4.0–10.5)
nRBC: 0 % (ref 0.0–0.2)

## 2022-03-21 MED ORDER — ONDANSETRON HCL 4 MG/2ML IJ SOLN
4.0000 mg | Freq: Once | INTRAMUSCULAR | Status: AC
Start: 2022-03-21 — End: 2022-03-21
  Administered 2022-03-21: 4 mg via INTRAVENOUS
  Filled 2022-03-21: qty 2

## 2022-03-21 MED ORDER — KETOROLAC TROMETHAMINE 15 MG/ML IJ SOLN
15.0000 mg | Freq: Once | INTRAMUSCULAR | Status: AC
Start: 2022-03-21 — End: 2022-03-21
  Administered 2022-03-21: 15 mg via INTRAVENOUS
  Filled 2022-03-21: qty 1

## 2022-03-21 MED ORDER — SODIUM CHLORIDE 0.9 % IV BOLUS
1000.0000 mL | Freq: Once | INTRAVENOUS | Status: AC
  Administered 2022-03-21: 1000 mL via INTRAVENOUS

## 2022-03-21 MED ORDER — DIPHENHYDRAMINE HCL 25 MG PO CAPS
25.0000 mg | ORAL_CAPSULE | Freq: Once | ORAL | Status: AC
  Administered 2022-03-21: 25 mg via ORAL
  Filled 2022-03-21: qty 1

## 2022-03-21 MED ORDER — METOCLOPRAMIDE HCL 5 MG/ML IJ SOLN
10.0000 mg | Freq: Once | INTRAMUSCULAR | Status: AC
  Administered 2022-03-21: 10 mg via INTRAVENOUS
  Filled 2022-03-21: qty 2

## 2022-03-21 NOTE — ED Provider Notes (Signed)
Assumed care from [facility], PA-C at shift change pending labs and CT head.  See his note for full HPI.  In short, patient is 52 year old female who presents to the ED due to headache x 2 days.  Admits to photophobia and phonophobia.  History of migraines, follows neurology. Physical Exam  BP (!) 144/95   Pulse 84   Temp 98.3 F (36.8 C) (Oral)   Resp 16   Ht 5' 6"$  (1.676 m)   Wt 91.6 kg   SpO2 99%   BMI 32.60 kg/m   Physical Exam Vitals and nursing note reviewed.  Constitutional:      General: She is not in acute distress.    Appearance: She is not ill-appearing.  HENT:     Head: Normocephalic.  Eyes:     Pupils: Pupils are equal, round, and reactive to light.  Cardiovascular:     Rate and Rhythm: Normal rate and regular rhythm.     Pulses: Normal pulses.     Heart sounds: Normal heart sounds. No murmur heard.    No friction rub. No gallop.  Pulmonary:     Effort: Pulmonary effort is normal.     Breath sounds: Normal breath sounds.  Abdominal:     General: Abdomen is flat. There is no distension.     Palpations: Abdomen is soft.     Tenderness: There is no abdominal tenderness. There is no guarding or rebound.  Musculoskeletal:        General: Normal range of motion.     Cervical back: Neck supple.  Skin:    General: Skin is warm and dry.  Neurological:     General: No focal deficit present.     Mental Status: She is alert.  Psychiatric:        Mood and Affect: Mood normal.        Behavior: Behavior normal.     Procedures  Procedures  ED Course / MDM    Medical Decision Making Amount and/or Complexity of Data Reviewed Independent Historian: spouse Labs: ordered. Decision-making details documented in ED Course. Radiology: ordered and independent interpretation performed. Decision-making details documented in ED Course.  Risk Prescription drug management.   Assumed care at shift change pending labs and CT head.  Patient presents with persistent  headache.  History of migraines.  Neurological exam nonfocal.   8:37 AM reassessed patient at bedside.  Patient admits to some improvement in pain however, pain still present.  CT head personally reviewed and interpreted which is negative for any acute abnormalities.  Labs are reassuring.  Patient denies any fever or chills.  No meningismus to suggest meningitis.  Suspect symptoms related to migraine.  Will give Toradol and reassess for improvement in symptoms.  9:44 AM reassessed patient after IV Toradol.  Patient admits to significant improvement in headache and notes she is ready to go home.  Patient follows neurology.  Advised patient to call her neurologist to schedule an appointment for further evaluation of her migraines.  No focal deficits to suggest CVA.  CT negative for bleed or other acute abnormalities.  No infectious symptoms to suggest meningitis.  Suspect headache likely related to migraine.  Patient stable for discharge. Strict ED precautions discussed with patient. Patient states understanding and agrees to plan. Patient discharged home in no acute distress and stable vitals  Has PCP      Suzy Bouchard, PA-C 03/21/22 0948    Hayden Rasmussen, MD 03/21/22 1655

## 2022-03-21 NOTE — ED Provider Notes (Signed)
[facility] Provider Note   CSN: [id number] Arrival date & time: 03/21/22  0247     History  Chief Complaint  Patient presents with   Headache    Gina Mendez is a 52 y.o. female.  HPI   Patient with medical history including asthma, allergies presents with complaints of a headache.  Patient states headache has been going on for last 2 days, states it came on gradually, states she feels in the back of her head and now feels in the front of her head, has associated photophobia increase sensitivity noise, states she does see some doublesu and will have occasional paresthesias in her fingers and feet, she states that this is typical for her headaches.  She states that she came in becase they do not last this long patient has been dealing with headaches for last few years.  She states that she been taking pain medication with relief.    Home Medications Prior to Admission medications   Medication Sig Start Date End Date Taking? Authorizing Provider  albuterol (VENTOLIN HFA) 108 (90 Base) MCG/ACT inhaler Inhale 1-2 puffs into the lungs every 6 (six) hours as needed for wheezing or shortness of breath.    [provider]  cetirizine (ZYRTEC) 10 MG tablet Take 10 mg by mouth daily. 09/06/19   [provider]  furosemide (LASIX) 20 MG tablet Take 1 tablet (20 mg total) by mouth as needed (weight gain of 3 lbs overnight or 5 lbs in 1 week). 09/30/19   Donato Heinz, MD  ibuprofen (ADVIL) 800 MG tablet Take 800 mg by mouth 3 (three) times daily as needed. 08/27/19   [provider]  Olopatadine HCl 0.2 % SOLN Apply 1 drop to eye every morning. 06/02/19   [provider]  omeprazole (PRILOSEC) 20 MG capsule Take 20 mg by mouth daily. 01/11/20   [provider]  pantoprazole (PROTONIX) 40 MG tablet Take 1 tablet (40 mg total) by mouth daily. 09/16/21   Palumbo, April, MD  phenazopyridine (PYRIDIUM) 200  MG tablet Take 1 tablet (200 mg total) by mouth 3 (three) times daily. 03/09/22   Melynda Ripple, NP  predniSONE (STERAPRED UNI-PAK 21 TAB) 10 MG (21) TBPK tablet Take by mouth daily. Take 6 tabs by mouth daily  for 2 days, then 5 tabs for 2 days, then 4 tabs for 2 days, then 3 tabs for 2 days, 2 tabs for 2 days, then 1 tab by mouth daily for 2 days 03/09/22   Melynda Ripple, NP  topiramate (TOPAMAX) 50 MG tablet Take 50 mg by mouth as needed. 11/02/19   [provider]      Allergies    Almond oil, Oxycodone-acetaminophen, Codeine, Flagyl [metronidazole], and Promethazine    Review of Systems   Review of Systems  Constitutional:  Negative for chills and fever.  Eyes:  Positive for photophobia.  Respiratory:  Negative for shortness of breath.   Cardiovascular:  Negative for chest pain.  Gastrointestinal:  Negative for abdominal pain.  Neurological:  Positive for headaches.    Physical Exam Updated Vital Signs BP (!) 144/95   Pulse 84   Temp 98.3 F (36.8 C) (Oral)   Resp 16   Ht 5' 6"$  (1.676 m)   Wt 91.6 kg   SpO2 99%   BMI 32.60 kg/m  Physical Exam Vitals and nursing note reviewed.  Constitutional:      General: She is not in acute  distress.    Appearance: She is not ill-appearing.  HENT:     Head: Normocephalic and atraumatic.     Nose: No congestion.  Eyes:     Conjunctiva/sclera: Conjunctivae normal.  Cardiovascular:     Rate and Rhythm: Normal rate and regular rhythm.     Pulses: Normal pulses.     Heart sounds: No murmur heard.    No friction rub. No gallop.  Pulmonary:     Effort: No respiratory distress.     Breath sounds: No wheezing, rhonchi or rales.  Skin:    General: Skin is warm and dry.  Neurological:     Mental Status: She is alert.     GCS: GCS eye subscore is 4. GCS verbal subscore is 5. GCS motor subscore is 6.     Cranial Nerves: Cranial nerves 2-12 are intact.     Sensory: Sensation is intact.     Motor: No weakness.     Coordination:  Romberg sign negative. Finger-Nose-Finger Test normal.     Gait: Gait is intact.     Comments: Cranial nerves II through XII grossly intact no difficulty with word finding following two-step commands there is no unilateral weakness present.  Psychiatric:        Mood and Affect: Mood normal.     ED Results / Procedures / Treatments   Labs (all labs ordered are listed, but only abnormal results are displayed) Labs Reviewed  BASIC METABOLIC PANEL  CBC WITH DIFFERENTIAL/PLATELET    EKG None  Radiology No results found.  Procedures Procedures    Medications Ordered in ED Medications  metoCLOPramide (REGLAN) injection 10 mg (10 mg Intravenous Given 03/21/22 0644)  ondansetron (ZOFRAN) injection 4 mg (4 mg Intravenous Given 03/21/22 0644)  diphenhydrAMINE (BENADRYL) capsule 25 mg (25 mg Oral Given 03/21/22 0642)  sodium chloride 0.9 % bolus 1,000 mL (1,000 mLs Intravenous New Bag/Given 03/21/22 [id number])    ED Course/ Medical Decision Making/ A&P                             Medical Decision Making Amount and/or Complexity of Data Reviewed Labs: ordered. Radiology: ordered.  Risk Prescription drug management.   This patient presents to the ED for concern of headache, this involves an extensive number of treatment options, and is a complaint that carries with it a high risk of complications and morbidity.  The differential diagnosis includes intracranial bleed, intracranial mass, CVA,    Additional history obtained:  Additional history obtained from N/A External records from outside source obtained and reviewed including neurology note   Co morbidities that complicate the patient evaluation  Hypertension  Social Determinants of Health:  N/A    Lab Tests:  I Ordered, and personally interpreted labs.  The pertinent results include: CBC, CMP pending at this time   Imaging Studies ordered:  I ordered imaging studies including CT head I independently visualized  and interpreted imaging which showed pending at the time I agree with the radiologist interpretation   Cardiac Monitoring:  The patient was maintained on a cardiac monitor.  I personally viewed and interpreted the cardiac monitored which showed an underlying rhythm of: N/A   Medicines ordered and prescription drug management:  I ordered medication including migraine cocktail I have reviewed the patients home medicines and have made adjustments as needed  Critical Interventions:  N/A   Reevaluation:  Presents with a headache, she had a benign physical exam,  presentation seems consistent with a complex migraine but due to her endorsement of headache lasting longer than 2 days which is atypical of her we will proceed with CT imaging.  Provide with my current cocktail and reassess  Consultations Obtained:  N/A    Test Considered:  N/A    Rule out Suspicion for intracranial bleed is low at this time no recent head trauma, not on anticoag's. low suspicion for CVA she has no focal deficit present my exam.  Low suspicion for dissection of the vertebral or carotid artery as presentation atypical of etiology.  Low suspicion for meningitis as she has no meningeal sign present..     Dispostion and problem list  Due to shift change patient will be handed off to [facility]   Suspect patient is having from a complex migraine, anticipate discharge home.  Follow-up on basic lab work CT imaging, treat accordingly, would recommend that she follows up with neurology.            Final Clinical Impression(s) / ED Diagnoses Final diagnoses:  Bad headache    Rx / DC Orders ED Discharge Orders     None         Marcello Fennel, PA-C 03/21/22 [id number]    Fatima Blank, MD 03/21/22 [phone]

## 2022-03-21 NOTE — Discharge Instructions (Addendum)
Likely your symptoms from a migraine, recommend over-the-counter pain medications, please remember to decrease  stress, get plenty of sleep, and stay hydrated.  This will help decrease frequent headaches.  I recommend a follow-up with your neurologist and/or the one that provided you.  Come back to the emergency department if you develop chest pain, shortness of breath, severe abdominal pain, uncontrolled nausea, vomiting, diarrhea.

## 2022-03-21 NOTE — ED Triage Notes (Signed)
Pt reports posterior headache that radiates down to her neck onset 2 days ago associated with tenderness to back of her head with palpation. She also reports double vision with her headache. Hx of headaches but this feels different because this headache is just in one spot.

## 2022-05-09 ENCOUNTER — Other Ambulatory Visit (HOSPITAL_COMMUNITY): Payer: Self-pay | Admitting: Surgery

## 2022-05-09 DIAGNOSIS — R1013 Epigastric pain: Secondary | ICD-10-CM

## 2022-05-15 ENCOUNTER — Other Ambulatory Visit: Payer: Self-pay

## 2022-05-15 ENCOUNTER — Emergency Department (HOSPITAL_BASED_OUTPATIENT_CLINIC_OR_DEPARTMENT_OTHER)
Admission: EM | Admit: 2022-05-15 | Discharge: 2022-05-15 | Disposition: A | Discharge status: Home / Self Care | Payer: Medicaid Other | Attending: Emergency Medicine | Admitting: Emergency Medicine

## 2022-05-15 ENCOUNTER — Encounter (HOSPITAL_BASED_OUTPATIENT_CLINIC_OR_DEPARTMENT_OTHER): Payer: Self-pay | Admitting: Emergency Medicine

## 2022-05-15 ENCOUNTER — Emergency Department (HOSPITAL_BASED_OUTPATIENT_CLINIC_OR_DEPARTMENT_OTHER): Payer: Medicaid Other

## 2022-05-15 ENCOUNTER — Other Ambulatory Visit (HOSPITAL_BASED_OUTPATIENT_CLINIC_OR_DEPARTMENT_OTHER): Payer: Self-pay

## 2022-05-15 DIAGNOSIS — N3000 Acute cystitis without hematuria: Secondary | ICD-10-CM | POA: Diagnosis not present

## 2022-05-15 DIAGNOSIS — R1013 Epigastric pain: Secondary | ICD-10-CM | POA: Diagnosis present

## 2022-05-15 DIAGNOSIS — R1084 Generalized abdominal pain: Secondary | ICD-10-CM

## 2022-05-15 HISTORY — DX: Essential (primary) hypertension: I10

## 2022-05-15 LAB — CBC WITH DIFFERENTIAL/PLATELET
Abs Immature Granulocytes: 0.01 10*3/uL (ref 0.00–0.07)
Basophils Absolute: 0 10*3/uL (ref 0.0–0.1)
Basophils Relative: 1 %
Eosinophils Absolute: 0.1 10*3/uL (ref 0.0–0.5)
Eosinophils Relative: 2 %
HCT: 37.5 % (ref 36.0–46.0)
Hemoglobin: 12.6 g/dL (ref 12.0–15.0)
Immature Granulocytes: 0 %
Lymphocytes Relative: 57 %
Lymphs Abs: 2 10*3/uL (ref 0.7–4.0)
MCH: 27.5 pg (ref 26.0–34.0)
MCHC: 33.6 g/dL (ref 30.0–36.0)
MCV: 81.7 fL (ref 80.0–100.0)
Monocytes Absolute: 0.4 10*3/uL (ref 0.1–1.0)
Monocytes Relative: 11 %
Neutro Abs: 1 10*3/uL — ABNORMAL LOW (ref 1.7–7.7)
Neutrophils Relative %: 29 %
Platelets: 361 10*3/uL (ref 150–400)
RBC: 4.59 MIL/uL (ref 3.87–5.11)
RDW: 12.1 % (ref 11.5–15.5)
WBC: 3.5 10*3/uL — ABNORMAL LOW (ref 4.0–10.5)
nRBC: 0 % (ref 0.0–0.2)

## 2022-05-15 LAB — WET PREP, GENITAL
Clue Cells Wet Prep HPF POC: NONE SEEN
Sperm: NONE SEEN
Trich, Wet Prep: NONE SEEN
WBC, Wet Prep HPF POC: <10 (ref <10)
Yeast Wet Prep HPF POC: NONE SEEN

## 2022-05-15 LAB — COMPREHENSIVE METABOLIC PANEL
ALT: 15 U/L (ref 0–44)
AST: 19 U/L (ref 15–41)
Albumin: 3.9 g/dL (ref 3.5–5.0)
Alkaline Phosphatase: 69 U/L (ref 38–126)
Anion gap: 6 (ref 5–15)
BUN: 8 mg/dL (ref 6–20)
CO2: 28 mmol/L (ref 22–32)
Calcium: 9.3 mg/dL (ref 8.9–10.3)
Chloride: 106 mmol/L (ref 98–111)
Creatinine, Ser: 0.69 mg/dL (ref 0.44–1.00)
GFR, Estimated: >60 mL/min (ref >60)
Glucose, Bld: 91 mg/dL (ref 70–99)
Potassium: 3.9 mmol/L (ref 3.5–5.1)
Sodium: 140 mmol/L (ref 135–145)
Total Bilirubin: 1 mg/dL (ref 0.3–1.2)
Total Protein: 7 g/dL (ref 6.5–8.1)

## 2022-05-15 LAB — URINALYSIS, ROUTINE W REFLEX MICROSCOPIC
Bilirubin Urine: NEGATIVE
Glucose, UA: NEGATIVE mg/dL
Ketones, ur: NEGATIVE mg/dL
Nitrite: NEGATIVE
Protein, ur: NEGATIVE mg/dL
Specific Gravity, Urine: 1.029 (ref 1.005–1.030)
WBC, UA: >50 WBC/hpf (ref 0–5)
pH: 6.5 (ref 5.0–8.0)

## 2022-05-15 LAB — LIPASE, BLOOD: Lipase: <10 U/L — ABNORMAL LOW (ref 11–51)

## 2022-05-15 LAB — HIV ANTIBODY (ROUTINE TESTING W REFLEX): HIV Screen 4th Generation wRfx: NONREACTIVE

## 2022-05-15 MED ORDER — IOHEXOL 350 MG/ML SOLN
100.0000 mL | Freq: Once | INTRAVENOUS | Status: AC | PRN
  Administered 2022-05-15: 85 mL via INTRAVENOUS

## 2022-05-15 MED ORDER — OMEPRAZOLE 20 MG PO CPDR
20.0000 mg | DELAYED_RELEASE_CAPSULE | Freq: Every day | ORAL | 0 refills | Status: DC
  Filled 2022-05-15: qty 30, 30d supply, fill #0

## 2022-05-15 MED ORDER — CEPHALEXIN 500 MG PO CAPS
500.0000 mg | ORAL_CAPSULE | Freq: Four times a day (QID) | ORAL | 0 refills | Status: DC
  Filled 2022-05-15: qty 28, 7d supply, fill #0

## 2022-05-15 MED ORDER — SODIUM CHLORIDE 0.9 % IV SOLN: Freq: Once | INTRAVENOUS | Status: AC

## 2022-05-15 NOTE — ED Notes (Signed)
Pt given discharge instructions and reviewed prescriptions. Opportunities given for questions. Pt verbalizes understanding. PIV removed x1. Peytan Andringa R, RN 

## 2022-05-15 NOTE — ED Notes (Signed)
Pt discharged to home using teachback Method. Discharge instructions have been discussed with patient and/or family members. Pt verbally acknowledges understanding d/c instructions, has been given opportunity for questions to be answered, and endorses comprehension to checkout at registration before leaving.  

## 2022-05-15 NOTE — ED Triage Notes (Signed)
Pt arrives pov, steady gait, with c/o epigastric  pain radiating to back "for months", tx for same, and urinary frequency x 4 days that has improved. Endorses nausea and diarrhea yesterday.  Pt also reports bilateral leg numbness "for awhile" after sitting or lying

## 2022-05-15 NOTE — ED Provider Notes (Signed)
[place] EMERGENCY DEPARTMENT AT [facility] Provider Note   CSN: [id number] Arrival date & time: 05/15/22  4098     History  Chief Complaint  Patient presents with   Abdominal Pain    Gina Mendez is a 52 y.o. female.  HPI Patient reports she has been persistently having worsening epigastric pain.  It is aching and sharp in quality.  She reports pain radiates through to her back in the center.  She indicates pain down the center of her abdomen from her epigastric down to just below the umbilicus.  She reports pain stays in this distribution and does not really involve her lower abdomen or her flanks.  She reports in association with this she gets feeling of numbness in her legs.  The numbness occurs when she has either been lying supine or in a sitting position.  She reports that standing the symptoms resolved.  Patient reports that she does get urinary urgency but has not had loss of continence.  She reports that she has been getting blood in her urine and she is seeing more blood lately.  Patient reports that she has had evaluation and there was thought that it may be a gallstone.  However this was not conclusive and she is supposed to get a follow-up HIDA scan.  Patient reports a long history of reflux and a distant ulcer.  She reports she is compliant with her omeprazole.    Home Medications Prior to Admission medications   Medication Sig Start Date End Date Taking? Authorizing Provider  cephALEXin (KEFLEX) 500 MG capsule Take 1 capsule (500 mg total) by mouth 4 (four) times daily. 05/15/22  Yes Arby Barrette, MD  omeprazole (PRILOSEC) 20 MG capsule Take 1 capsule (20 mg total) by mouth daily. 05/15/22  Yes Arby Barrette, MD  albuterol (VENTOLIN HFA) 108 (90 Base) MCG/ACT inhaler Inhale 1-2 puffs into the lungs every 6 (six) hours as needed for wheezing or shortness of breath.    [provider]  cetirizine (ZYRTEC) 10 MG tablet Take 10 mg by mouth daily.  09/06/19   [provider]  furosemide (LASIX) 20 MG tablet Take 1 tablet (20 mg total) by mouth as needed (weight gain of 3 lbs overnight or 5 lbs in 1 week). 09/30/19   Little Ishikawa, MD  ibuprofen (ADVIL) 800 MG tablet Take 800 mg by mouth 3 (three) times daily as needed. 08/27/19   [provider]  Olopatadine HCl 0.2 % SOLN Apply 1 drop to eye every morning. 06/02/19   [provider]  omeprazole (PRILOSEC) 20 MG capsule Take 20 mg by mouth daily. 01/11/20   [provider]  pantoprazole (PROTONIX) 40 MG tablet Take 1 tablet (40 mg total) by mouth daily. 09/16/21   Palumbo, April, MD  phenazopyridine (PYRIDIUM) 200 MG tablet Take 1 tablet (200 mg total) by mouth 3 (three) times daily. 03/09/22   Radford Pax, NP  predniSONE (STERAPRED UNI-PAK 21 TAB) 10 MG (21) TBPK tablet Take by mouth daily. Take 6 tabs by mouth daily  for 2 days, then 5 tabs for 2 days, then 4 tabs for 2 days, then 3 tabs for 2 days, 2 tabs for 2 days, then 1 tab by mouth daily for 2 days 03/09/22   Radford Pax, NP  topiramate (TOPAMAX) 50 MG tablet Take 50 mg by mouth as needed. 11/02/19   [provider]      Allergies    Almond oil, Oxycodone-acetaminophen, Azithromycin, Codeine,  Flagyl [metronidazole], and Promethazine    Review of Systems   Review of Systems  Physical Exam Updated Vital Signs BP (!) 159/89   Pulse 74   Temp 98.5 F (36.9 C) (Oral)   Resp 18   Ht 5\' 6"  (1.676 m)   Wt 94.3 kg   SpO2 99%   BMI 33.57 kg/m  Physical Exam Constitutional:      Comments: Alert nontoxic.  No respiratory distress.  Mental status clear.  Clinically well in appearance.  HENT:     Mouth/Throat:     Pharynx: Oropharynx is clear.  Eyes:     Extraocular Movements: Extraocular movements intact.  Cardiovascular:     Rate and Rhythm: Normal rate and regular rhythm.  Pulmonary:     Effort: Pulmonary effort is normal.     Breath sounds: Normal breath sounds.  Abdominal:      Comments: Patient has pain in the epigastrium.  Also discomfort to palpation through the central abdomen to the umbilicus.  She denies pain in the lateral upper quadrants or the lower abdomen.  Genitourinary:    Comments: External female genitalia normal in appearance.  Mild amount of whitish discharge in the vaginal vault.  No purulent discharge or blood.  Uterus mildly tender to palpation.  No significant cervical motion tenderness. Musculoskeletal:     Comments: No peripheral edema at this time.  Dorsalis pedis pulses are 2+ and strong.  Feet are warm and dry.  Skin:    General: Skin is warm and dry.  Neurological:     General: No focal deficit present.     Mental Status: She is oriented to person, place, and time.     Motor: No weakness.     Coordination: Coordination normal.  Psychiatric:        Mood and Affect: Mood normal.     ED Results / Procedures / Treatments   Labs (all labs ordered are listed, but only abnormal results are displayed) Labs Reviewed  LIPASE, BLOOD - Abnormal; Notable for the following components:      Result Value   Lipase <10 (*)    All other components within normal limits  CBC WITH DIFFERENTIAL/PLATELET - Abnormal; Notable for the following components:   WBC 3.5 (*)    Neutro Abs 1.0 (*)    All other components within normal limits  URINALYSIS, ROUTINE W REFLEX MICROSCOPIC - Abnormal; Notable for the following components:   Hgb urine dipstick SMALL (*)    Leukocytes,Ua MODERATE (*)    Bacteria, UA RARE (*)    All other components within normal limits  WET PREP, GENITAL  COMPREHENSIVE METABOLIC PANEL  RPR  HIV ANTIBODY (ROUTINE TESTING W REFLEX)  GC/CHLAMYDIA PROBE AMP ([place]) NOT AT [facility]    EKG None  Radiology No results found.  Procedures Procedures    Medications Ordered in ED Medications  0.9 %  sodium chloride infusion ( Intravenous New Bag/Given 05/15/22 0908)  iohexol (OMNIPAQUE) 350 MG/ML injection 100 mL (85 mLs  Intravenous Contrast Given 05/15/22 1009)    ED Course/ Medical Decision Making/ A&P                             Medical Decision Making Amount and/or Complexity of Data Reviewed Labs: ordered. Radiology: ordered.  Risk Prescription drug management.   Patient presents with a persistent and worsening epigastric and central abdominal pain.  She has radiation through to the thoracic back.  Patient also describes symptoms of lower extremity numbness that waxes and wanes.  She has not experienced weakness or gait dysfunction.  There has been associated hematuria and urgency for urination but not incontinence.  Will obtain CT scan to rule out vascular pathology such as aortic dissection or aneurysm.  Differential diagnosis also includes gastric or duodenal ulcer\pancreatitis\biliary colic\colitis\kidney stone or renal artery pathology.  CT scan radiologist notes 2 small pulmonary nodules for surveillance but no acute or suspicious appearing nodules.  Otherwise negative for any acute findings.  No dissection, no obstruction.  Urinalysis is positive with greater than 50 white cells good specimen.  Will treat for UTI with Keflex.  Patient reports she did have some urgency and burning in the past couple days.  With exam does not have significant discharge or bleeding.  Mild tenderness but lower suspicion for any cervicitis at this time.  Do not think patient needs empiric treatment.  Patient reports she does need a refill on her omeprazole.  At this time differential diagnosis includes reflux or peptic ulcer disease.  Patient will continue to follow-up with her doctor and gastroenterology.  Patient was counseled on letting her physician know about 2 pulmonary nodules identified incidentally on CT scan.  She was made aware of the need to follow-up with 3 to 6 months repeat CT scan.  Patient voices understanding.  This is included in the discharge instructions.        Final Clinical Impression(s)  / ED Diagnoses Final diagnoses:  Generalized abdominal pain  Acute cystitis without hematuria    Rx / DC Orders ED Discharge Orders          Ordered    cephALEXin (KEFLEX) 500 MG capsule  4 times daily        05/15/22 1256    omeprazole (PRILOSEC) 20 MG capsule  Daily        05/15/22 1256              Arby Barrette, MD 05/15/22 1304

## 2022-05-15 NOTE — Discharge Instructions (Signed)
1.  You had a CT scan of your chest abdomen pelvis.  There were no major abnormal findings.  However, the radiologist made note of small nodule in your lung that needs to have surveillance.  Recommendation is for repeat CT scan in 3 to 6 months.  Be sure that your family doctor is aware of this recommendation.  2.  Your urine tested positive for urinary tract infection.  A urine culture will also be done to confirm the type of antibiotic needed and possible type of infection.  At this time start Keflex 4 times a day as prescribed. 3.  Continue your omeprazole daily. 4.  Follow-up with your doctor for recheck and continued monitoring and recommendations for ongoing problems with persistent abdominal pain.  Return to emergency department immediately if you have new worsening or concerning symptoms.

## 2022-05-16 LAB — GC/CHLAMYDIA PROBE AMP (~~LOC~~) NOT AT ARMC
Chlamydia: NEGATIVE
Comment: NEGATIVE
Comment: NORMAL
Neisseria Gonorrhea: NEGATIVE

## 2022-05-16 LAB — RPR: RPR Ser Ql: NONREACTIVE

## 2022-05-18 ENCOUNTER — Emergency Department (HOSPITAL_BASED_OUTPATIENT_CLINIC_OR_DEPARTMENT_OTHER): Payer: Medicaid Other | Admitting: Radiology

## 2022-05-18 ENCOUNTER — Emergency Department (HOSPITAL_BASED_OUTPATIENT_CLINIC_OR_DEPARTMENT_OTHER): Payer: Medicaid Other

## 2022-05-18 ENCOUNTER — Encounter (HOSPITAL_BASED_OUTPATIENT_CLINIC_OR_DEPARTMENT_OTHER): Payer: Self-pay | Admitting: Emergency Medicine

## 2022-05-18 ENCOUNTER — Other Ambulatory Visit: Payer: Self-pay

## 2022-05-18 ENCOUNTER — Emergency Department (HOSPITAL_BASED_OUTPATIENT_CLINIC_OR_DEPARTMENT_OTHER)
Admission: EM | Admit: 2022-05-18 | Discharge: 2022-05-18 | Disposition: A | Discharge status: Home / Self Care | Payer: Medicaid Other | Attending: Emergency Medicine | Admitting: Emergency Medicine

## 2022-05-18 DIAGNOSIS — Z7952 Long term (current) use of systemic steroids: Secondary | ICD-10-CM | POA: Diagnosis not present

## 2022-05-18 DIAGNOSIS — R0789 Other chest pain: Secondary | ICD-10-CM | POA: Insufficient documentation

## 2022-05-18 DIAGNOSIS — I10 Essential (primary) hypertension: Secondary | ICD-10-CM | POA: Diagnosis not present

## 2022-05-18 DIAGNOSIS — Z7951 Long term (current) use of inhaled steroids: Secondary | ICD-10-CM | POA: Insufficient documentation

## 2022-05-18 DIAGNOSIS — J45909 Unspecified asthma, uncomplicated: Secondary | ICD-10-CM | POA: Diagnosis not present

## 2022-05-18 DIAGNOSIS — G8929 Other chronic pain: Secondary | ICD-10-CM | POA: Insufficient documentation

## 2022-05-18 DIAGNOSIS — M79601 Pain in right arm: Secondary | ICD-10-CM | POA: Diagnosis not present

## 2022-05-18 DIAGNOSIS — Z79899 Other long term (current) drug therapy: Secondary | ICD-10-CM | POA: Insufficient documentation

## 2022-05-18 LAB — TROPONIN I (HIGH SENSITIVITY)
Troponin I (High Sensitivity): 2 ng/L (ref <18)
Troponin I (High Sensitivity): 3 ng/L (ref <18)

## 2022-05-18 LAB — BASIC METABOLIC PANEL
Anion gap: 7 (ref 5–15)
BUN: 11 mg/dL (ref 6–20)
CO2: 25 mmol/L (ref 22–32)
Calcium: 9.1 mg/dL (ref 8.9–10.3)
Chloride: 106 mmol/L (ref 98–111)
Creatinine, Ser: 0.68 mg/dL (ref 0.44–1.00)
GFR, Estimated: >60 mL/min (ref >60)
Glucose, Bld: 109 mg/dL — ABNORMAL HIGH (ref 70–99)
Potassium: 3.5 mmol/L (ref 3.5–5.1)
Sodium: 138 mmol/L (ref 135–145)

## 2022-05-18 LAB — CBC
HCT: 37.9 % (ref 36.0–46.0)
Hemoglobin: 12.3 g/dL (ref 12.0–15.0)
MCH: 26.9 pg (ref 26.0–34.0)
MCHC: 32.5 g/dL (ref 30.0–36.0)
MCV: 82.9 fL (ref 80.0–100.0)
Platelets: 340 10*3/uL (ref 150–400)
RBC: 4.57 MIL/uL (ref 3.87–5.11)
RDW: 12 % (ref 11.5–15.5)
WBC: 4.4 10*3/uL (ref 4.0–10.5)
nRBC: 0 % (ref 0.0–0.2)

## 2022-05-18 MED ORDER — METHYLPREDNISOLONE 4 MG PO TBPK: ORAL_TABLET | ORAL | 0 refills | Status: DC

## 2022-05-18 NOTE — ED Triage Notes (Signed)
Pt arrives to ED with c/o chest pains that started yesterday. The CP is described as tightness wih radiation down the right arm.

## 2022-05-18 NOTE — Discharge Instructions (Addendum)
You are seen the emergency department today for chest pain and right arm pain.  As we discussed your workup was reassuring.  The ultrasound of your right arm did not show any evidence of a blood clot.  As we discussed some of the symptoms that you are describing could be coming from your neck and some arthritis.  I recommend that we try a course of steroids, and have you follow-up with your primary doctor.  You may benefit from seeing an orthopedist or spine specialist, but will have you discuss this with your doctor.  You can also take over-the-counter medications as needed for pain.  Continue to monitor how you're doing and return to the ER for new or worsening symptoms.

## 2022-05-18 NOTE — ED Provider Notes (Signed)
[place] EMERGENCY DEPARTMENT AT [facility] Provider Note   CSN: [id number] Arrival date & time: 05/18/22  1334     History  Chief Complaint  Patient presents with   Chest Pain    Gina Mendez is a 52 y.o. female with history of asthma and HTN who presents to the ER complaining of chest pain starting yesterday.  Describes in the right side of her chest and right upper back, radiating down into her right arm.  Intermittently some of the fingers in her right hand will go numb.  No neck or back pain.  No injuries noted.  States that she has already been seen by cardiologist and they found no abnormalities.  Her primary doctor will be sending her to a pulmonologist, as they said that her lung function was poor.  The chest pain has been going on during all of this, she says it has been intermittent for the past few months, just getting worse in the past few weeks.  The right arm pain started developing in the past few weeks as well.  She feels as though it is swollen.  She does report that she has a remote history of trauma in the right arm including embedded glass, this was over 20 years ago.   Chest Pain Associated symptoms: no palpitations and no shortness of breath        Home Medications Prior to Admission medications   Medication Sig Start Date End Date Taking? Authorizing Provider  methylPREDNISolone (MEDROL DOSEPAK) 4 MG TBPK tablet Take per package instructions 05/18/22  Yes Aveon Colquhoun T, PA-C  albuterol (VENTOLIN HFA) 108 (90 Base) MCG/ACT inhaler Inhale 1-2 puffs into the lungs every 6 (six) hours as needed for wheezing or shortness of breath.    [provider]  cephALEXin (KEFLEX) 500 MG capsule Take 1 capsule (500 mg total) by mouth 4 (four) times daily. 05/15/22   Arby Barrette, MD  cetirizine (ZYRTEC) 10 MG tablet Take 10 mg by mouth daily. 09/06/19   [provider]  furosemide (LASIX) 20 MG tablet Take 1 tablet (20 mg total) by mouth  as needed (weight gain of 3 lbs overnight or 5 lbs in 1 week). 09/30/19   Little Ishikawa, MD  ibuprofen (ADVIL) 800 MG tablet Take 800 mg by mouth 3 (three) times daily as needed. 08/27/19   [provider]  Olopatadine HCl 0.2 % SOLN Apply 1 drop to eye every morning. 06/02/19   [provider]  omeprazole (PRILOSEC) 20 MG capsule Take 20 mg by mouth daily. 01/11/20   [provider]  omeprazole (PRILOSEC) 20 MG capsule Take 1 capsule (20 mg total) by mouth daily. 05/15/22   Arby Barrette, MD  pantoprazole (PROTONIX) 40 MG tablet Take 1 tablet (40 mg total) by mouth daily. 09/16/21   Palumbo, April, MD  phenazopyridine (PYRIDIUM) 200 MG tablet Take 1 tablet (200 mg total) by mouth 3 (three) times daily. 03/09/22   Radford Pax, NP  topiramate (TOPAMAX) 50 MG tablet Take 50 mg by mouth as needed. 11/02/19   [provider]      Allergies    Almond oil, Oxycodone-acetaminophen, Azithromycin, Codeine, Flagyl [metronidazole], and Promethazine    Review of Systems   Review of Systems  Respiratory:  Negative for shortness of breath.   Cardiovascular:  Positive for chest pain. Negative for palpitations and leg swelling.  Musculoskeletal:  Positive for arthralgias.  All other systems reviewed and are negative.   Physical Exam  Updated Vital Signs BP 138/82 (BP Location: Right Arm)   Pulse 66   Temp 98.2 F (36.8 C)   Resp 17   SpO2 98%  Physical Exam Vitals and nursing note reviewed.  Constitutional:      Appearance: Normal appearance.  HENT:     Head: Normocephalic and atraumatic.  Eyes:     Conjunctiva/sclera: Conjunctivae normal.  Neck:     Comments: No cervical midline spinal tenderness, step-offs or crepitus Cardiovascular:     Rate and Rhythm: Normal rate and regular rhythm.  Pulmonary:     Effort: Pulmonary effort is normal. No respiratory distress.     Breath sounds: Normal breath sounds.  Abdominal:     General: There is no  distension.     Palpations: Abdomen is soft.     Tenderness: There is no abdominal tenderness.  Musculoskeletal:     Comments: Right lower arm with some nonspecific swelling on the dorsal aspect.  No breaks in the skin, no erythema, or point tenderness.  Strong distal pulses, normal grip strength, normal sensation in the hands.  Skin:    General: Skin is warm and dry.  Neurological:     General: No focal deficit present.     Mental Status: She is alert.     ED Results / Procedures / Treatments   Labs (all labs ordered are listed, but only abnormal results are displayed) Labs Reviewed  BASIC METABOLIC PANEL - Abnormal; Notable for the following components:      Result Value   Glucose, Bld 109 (*)    All other components within normal limits  CBC  TROPONIN I (HIGH SENSITIVITY)  TROPONIN I (HIGH SENSITIVITY)    EKG EKG Interpretation  Date/Time:  Saturday May 18 2022 13:39:04 EDT Ventricular Rate:  92 PR Interval:  158 QRS Duration: 68 QT Interval:  382 QTC Calculation: 472 R Axis:   45 Text Interpretation: Normal sinus rhythm Anterior infarct , age undetermined Abnormal ECG When compared with ECG of 15-Sep-2021 23:46, Anterior infarct is now Present Nonspecific T wave abnormality now evident in Anterior leads Q wave v3 is new Confirmed by Glynn Octave [phone]) on 05/18/2022 1:51:41 PM  Radiology US Venous Img Upper Right (DVT Study)  Result Date: 05/18/2022 CLINICAL DATA:  Right upper extremity pain. EXAM: Right UPPER EXTREMITY VENOUS DOPPLER ULTRASOUND TECHNIQUE: Gray-scale sonography with graded compression, as well as color Doppler and duplex ultrasound were performed to evaluate the upper extremity deep venous system from the level of the subclavian vein and including the jugular, axillary, basilic, radial, ulnar and upper cephalic vein. Spectral Doppler was utilized to evaluate flow at rest and with distal augmentation maneuvers. COMPARISON:  None Available. FINDINGS:  Contralateral Subclavian Vein: Respiratory phasicity is normal and symmetric with the symptomatic side. No evidence of thrombus. Normal compressibility. Internal Jugular Vein: No evidence of thrombus. Normal compressibility, respiratory phasicity and response to augmentation. Subclavian Vein: No evidence of thrombus. Normal compressibility, respiratory phasicity and response to augmentation. Axillary Vein: No evidence of thrombus. Normal compressibility, respiratory phasicity and response to augmentation. Cephalic Vein: No evidence of thrombus. Normal compressibility, respiratory phasicity and response to augmentation. Basilic Vein: No evidence of thrombus. Normal compressibility, respiratory phasicity and response to augmentation. Brachial Veins: No evidence of thrombus. Normal compressibility, respiratory phasicity and response to augmentation. Radial Veins: No evidence of thrombus. Normal compressibility, respiratory phasicity and response to augmentation. Ulnar Veins: No evidence of thrombus. Normal compressibility, respiratory phasicity and response to augmentation. Venous Reflux:  None  visualized. Other Findings:  None visualized. IMPRESSION: No evidence of DVT within the right upper extremity. Electronically Signed   By: Elgie Collard M.D.   On: 05/18/2022 16:20   DG Chest 2 View  Result Date: 05/18/2022 CLINICAL DATA:  Chest pain EXAM: CHEST - 2 VIEW COMPARISON:  Chest x-ray 09/15/21 and older. CT angiogram chest 05/15/2022 FINDINGS: No consolidation, pneumothorax or effusion. No edema. Normal cardiopericardial silhouette. Degenerative changes are seen along the spine IMPRESSION: No acute cardiopulmonary disease Electronically Signed   By: Karen Kays M.D.   On: 05/18/2022 14:14    Procedures Procedures    Medications Ordered in ED Medications - No data to display  ED Course/ Medical Decision Making/ A&P             HEART Score: 3                Medical Decision Making Amount and/or  Complexity of Data Reviewed Labs: ordered. Radiology: ordered.  Risk Prescription drug management.   This patient is a 52 y.o. female  who presents to the ED for concern of chest and right arm pain.   Differential diagnoses prior to evaluation: The emergent differential diagnosis includes, but is not limited to,  ACS, PE, DVT, radiculopathy, cellulitis. This is not an exhaustive differential.   Past Medical History / Co-morbidities: asthma and HTN   Additional history: Chart reviewed. Pertinent results include: Initial ER visit for similar symptoms at outside facility in October 2023, with reassuring workup.  She was referred to cardiology at that time, cannot review that note.  Patient did also have a right upper extremity ultrasound in September 2023 with similar symptoms of swelling and numbness, study was unremarkable.  Physical Exam: Physical exam performed. The pertinent findings include: Normal vital signs, no acute distress.  Heart regular rate and rhythm, lung sounds clear, normal respiratory effort.  Generalized swelling noted to the right lower arm, overall nonspecific.  No erythema or breaks in the skin.  Normal range of motion, sensation intact.  Strong distal pulses.  Lab Tests/Imaging studies: I personally interpreted labs/imaging and the pertinent results include: CBC and BMP unremarkable.  Negative troponin 2.  Chest x-ray unremarkable.  Right upper extremity ultrasound negative for DVT. I agree with the radiologist interpretation.  Cardiac monitoring: EKG obtained and interpreted by my attending physician which shows: Normal sinus rhythm nonspecific T wave change compared to prior, now present Q wave in V3   Disposition: After consideration of the diagnostic results and the patients response to treatment, I feel that emergency department workup does not suggest an emergent condition requiring admission or immediate intervention beyond what has been performed at this  time. Patient is to be discharged with recommendation to follow up with PCP in regards to today's hospital visit. Chest pain is not likely of cardiac or pulmonary etiology d/t presentation, Well's criteria for PE negative and negative DVT study, VSS, no tracheal deviation, no JVD or new murmur, RRR, breath sounds equal bilaterally, EKG without acute abnormalities, negative troponin, and negative CXR. Heart score of 3. Pt has been advised to return to the ED if CP becomes exertional, associated with diaphoresis or nausea, radiates to left jaw/arm, worsens or becomes concerning in any way. Will try medrol dose pack for right arm numbness with some possibility for cervical radiculopathy. No myelopathic symptoms necessitating emergent spinal imaging today. Pt appears reliable for follow up and is agreeable to discharge.   Final Clinical Impression(s) / ED Diagnoses Final diagnoses:  Right arm pain  Chronic chest pain    Rx / DC Orders ED Discharge Orders          Ordered    methylPREDNISolone (MEDROL DOSEPAK) 4 MG TBPK tablet        05/18/22 1720           Portions of this report may have been transcribed using voice recognition software. Every effort was made to ensure accuracy; however, inadvertent computerized transcription errors may be present.    Jeanella Flattery 05/18/22 1726    Maia Plan, MD 05/19/22 1130

## 2022-05-20 ENCOUNTER — Ambulatory Visit: Payer: Medicaid Other

## 2022-07-18 ENCOUNTER — Other Ambulatory Visit: Payer: Self-pay

## 2022-07-18 ENCOUNTER — Emergency Department (HOSPITAL_COMMUNITY): Payer: Medicaid Other

## 2022-07-18 ENCOUNTER — Encounter (HOSPITAL_COMMUNITY): Payer: Self-pay

## 2022-07-18 ENCOUNTER — Emergency Department (HOSPITAL_COMMUNITY)
Admission: EM | Admit: 2022-07-18 | Discharge: 2022-07-18 | Disposition: A | Discharge status: Home / Self Care | Payer: Medicaid Other | Attending: Emergency Medicine | Admitting: Emergency Medicine

## 2022-07-18 DIAGNOSIS — R079 Chest pain, unspecified: Secondary | ICD-10-CM

## 2022-07-18 DIAGNOSIS — R0789 Other chest pain: Secondary | ICD-10-CM | POA: Diagnosis not present

## 2022-07-18 DIAGNOSIS — Z79899 Other long term (current) drug therapy: Secondary | ICD-10-CM | POA: Diagnosis not present

## 2022-07-18 DIAGNOSIS — I10 Essential (primary) hypertension: Secondary | ICD-10-CM | POA: Insufficient documentation

## 2022-07-18 DIAGNOSIS — J45909 Unspecified asthma, uncomplicated: Secondary | ICD-10-CM | POA: Insufficient documentation

## 2022-07-18 LAB — CBC WITH DIFFERENTIAL/PLATELET
Abs Immature Granulocytes: 0.01 10*3/uL (ref 0.00–0.07)
Basophils Absolute: 0.1 10*3/uL (ref 0.0–0.1)
Basophils Relative: 1 %
Eosinophils Absolute: 0 10*3/uL (ref 0.0–0.5)
Eosinophils Relative: 1 %
HCT: 39.5 % (ref 36.0–46.0)
Hemoglobin: 12.8 g/dL (ref 12.0–15.0)
Immature Granulocytes: 0 %
Lymphocytes Relative: 56 %
Lymphs Abs: 2.3 10*3/uL (ref 0.7–4.0)
MCH: 27.6 pg (ref 26.0–34.0)
MCHC: 32.4 g/dL (ref 30.0–36.0)
MCV: 85.3 fL (ref 80.0–100.0)
Monocytes Absolute: 0.3 10*3/uL (ref 0.1–1.0)
Monocytes Relative: 7 %
Neutro Abs: 1.4 10*3/uL — ABNORMAL LOW (ref 1.7–7.7)
Neutrophils Relative %: 35 %
Platelets: 415 10*3/uL — ABNORMAL HIGH (ref 150–400)
RBC: 4.63 MIL/uL (ref 3.87–5.11)
RDW: 12.3 % (ref 11.5–15.5)
WBC: 4.1 10*3/uL (ref 4.0–10.5)
nRBC: 0 % (ref 0.0–0.2)

## 2022-07-18 LAB — COMPREHENSIVE METABOLIC PANEL
ALT: 18 U/L (ref 0–44)
AST: 22 U/L (ref 15–41)
Albumin: 3.3 g/dL — ABNORMAL LOW (ref 3.5–5.0)
Alkaline Phosphatase: 64 U/L (ref 38–126)
Anion gap: 8 (ref 5–15)
BUN: 6 mg/dL (ref 6–20)
CO2: 25 mmol/L (ref 22–32)
Calcium: 8.8 mg/dL — ABNORMAL LOW (ref 8.9–10.3)
Chloride: 105 mmol/L (ref 98–111)
Creatinine, Ser: 0.83 mg/dL (ref 0.44–1.00)
GFR, Estimated: >60 mL/min (ref >60)
Glucose, Bld: 117 mg/dL — ABNORMAL HIGH (ref 70–99)
Potassium: 3.7 mmol/L (ref 3.5–5.1)
Sodium: 138 mmol/L (ref 135–145)
Total Bilirubin: 1.3 mg/dL — ABNORMAL HIGH (ref 0.3–1.2)
Total Protein: 6.3 g/dL — ABNORMAL LOW (ref 6.5–8.1)

## 2022-07-18 LAB — TROPONIN I (HIGH SENSITIVITY)
Troponin I (High Sensitivity): 3 ng/L (ref <18)
Troponin I (High Sensitivity): 3 ng/L (ref <18)

## 2022-07-18 LAB — BRAIN NATRIURETIC PEPTIDE: B Natriuretic Peptide: 10 pg/mL (ref 0.0–100.0)

## 2022-07-18 MED ORDER — KETOROLAC TROMETHAMINE 15 MG/ML IJ SOLN
15.0000 mg | Freq: Once | INTRAMUSCULAR | Status: AC
  Administered 2022-07-18: 15 mg via INTRAVENOUS
  Filled 2022-07-18: qty 1

## 2022-07-18 MED ORDER — ONDANSETRON HCL 4 MG/2ML IJ SOLN
4.0000 mg | Freq: Once | INTRAMUSCULAR | Status: AC
  Administered 2022-07-18: 4 mg via INTRAVENOUS
  Filled 2022-07-18: qty 2

## 2022-07-18 MED ORDER — ACETAMINOPHEN 500 MG PO TABS
1000.0000 mg | ORAL_TABLET | Freq: Once | ORAL | Status: AC
  Administered 2022-07-18: 1000 mg via ORAL
  Filled 2022-07-18: qty 2

## 2022-07-18 MED ORDER — SODIUM CHLORIDE 0.9 % IV BOLUS
500.0000 mL | Freq: Once | INTRAVENOUS | Status: AC
  Administered 2022-07-18: 500 mL via INTRAVENOUS

## 2022-07-18 NOTE — ED Triage Notes (Signed)
Pt bib ems from home for c.o chest pain and headache since yesterday. Pt also reports swelling in her bilateral lower legs, recently placed on lasix with some relief. Nausea but no vomiting, sob when lying flat.  Pt given 324 ASA and 1 Nitro

## 2022-07-18 NOTE — Discharge Instructions (Signed)
If you develop recurrent, continued, or worsening chest pain, shortness of breath, fever, vomiting, abdominal or back pain, or any other new/concerning symptoms then return to the ER for evaluation.  

## 2022-07-18 NOTE — ED Provider Notes (Signed)
[place] EMERGENCY DEPARTMENT AT [facility] Provider Note   CSN: [id number] Arrival date & time: 07/18/22  1021     History  Chief Complaint  Patient presents with   Chest Pain    Gina Mendez is a 52 y.o. female.  HPI 52 year old female with a history of hypertension, hyperlipidemia, asthma, presents with chest pain.  She has been having chest pain whenever she lays flat for quite some time at night primarily.  However this morning about an hour prior to arrival she developed chest squeezing sensation in the middle of her chest.  This is similar to multiple prior ED visits per her.  She feels short of breath.  She is also nauseated which is not typical.  Denies abdominal pain, vomiting, or fever.  No new leg swelling but has on and off leg swelling this dependent on whether or not she takes furosemide.  That has been going on for couple years.  She is due to see cardiology for these chest pain and leg swelling episodes in about 1 month. EMS gave her aspirin and 1 nitroglycerin, and she feels like the pain is gone, though there is some residual discomfort.  Home Medications Prior to Admission medications   Medication Sig Start Date End Date Taking? Authorizing Provider  albuterol (VENTOLIN HFA) 108 (90 Base) MCG/ACT inhaler Inhale 1-2 puffs into the lungs every 6 (six) hours as needed for wheezing or shortness of breath.    [provider]  cephALEXin (KEFLEX) 500 MG capsule Take 1 capsule (500 mg total) by mouth 4 (four) times daily. 05/15/22   Arby Barrette, MD  cetirizine (ZYRTEC) 10 MG tablet Take 10 mg by mouth daily. 09/06/19   [provider]  furosemide (LASIX) 20 MG tablet Take 1 tablet (20 mg total) by mouth as needed (weight gain of 3 lbs overnight or 5 lbs in 1 week). 09/30/19   Little Ishikawa, MD  ibuprofen (ADVIL) 800 MG tablet Take 800 mg by mouth 3 (three) times daily as needed. 08/27/19   [provider]  methylPREDNISolone  (MEDROL DOSEPAK) 4 MG TBPK tablet Take per package instructions 05/18/22   Roemhildt, Lorin T, PA-C  Olopatadine HCl 0.2 % SOLN Apply 1 drop to eye every morning. 06/02/19   [provider]  omeprazole (PRILOSEC) 20 MG capsule Take 20 mg by mouth daily. 01/11/20   [provider]  omeprazole (PRILOSEC) 20 MG capsule Take 1 capsule (20 mg total) by mouth daily. 05/15/22   Arby Barrette, MD  pantoprazole (PROTONIX) 40 MG tablet Take 1 tablet (40 mg total) by mouth daily. 09/16/21   Palumbo, April, MD  phenazopyridine (PYRIDIUM) 200 MG tablet Take 1 tablet (200 mg total) by mouth 3 (three) times daily. 03/09/22   Radford Pax, NP  topiramate (TOPAMAX) 50 MG tablet Take 50 mg by mouth as needed. 11/02/19   [provider]      Allergies    Almond oil, Oxycodone-acetaminophen, Iodine, Azithromycin, Codeine, Flagyl [metronidazole], and Promethazine    Review of Systems   Review of Systems  Constitutional:  Negative for fever.  Respiratory:  Positive for shortness of breath.   Cardiovascular:  Positive for chest pain. Negative for leg swelling (none currently).  Gastrointestinal:  Positive for nausea. Negative for abdominal pain and vomiting.    Physical Exam Updated Vital Signs BP 133/82   Pulse 78   Temp (!) 97.4 F (36.3 C) (Temporal)   Resp 13   Ht 5\' 6"  (  1.676 m)   Wt 94.3 kg   SpO2 100%   BMI 33.57 kg/m  Physical Exam Vitals and nursing note reviewed.  Constitutional:      General: She is not in acute distress.    Appearance: She is well-developed. She is not ill-appearing or diaphoretic.  HENT:     Head: Normocephalic and atraumatic.  Cardiovascular:     Rate and Rhythm: Normal rate and regular rhythm.     Heart sounds: Normal heart sounds.  Pulmonary:     Effort: Pulmonary effort is normal.     Breath sounds: Normal breath sounds.  Abdominal:     Palpations: Abdomen is soft.     Tenderness: There is no abdominal tenderness.  Musculoskeletal:      Right lower leg: No edema.     Left lower leg: No edema.  Skin:    General: Skin is warm and dry.  Neurological:     Mental Status: She is alert.     ED Results / Procedures / Treatments   Labs (all labs ordered are listed, but only abnormal results are displayed) Labs Reviewed  COMPREHENSIVE METABOLIC PANEL - Abnormal; Notable for the following components:      Result Value   Glucose, Bld 117 (*)    Calcium 8.8 (*)    Total Protein 6.3 (*)    Albumin 3.3 (*)    Total Bilirubin 1.3 (*)    All other components within normal limits  CBC WITH DIFFERENTIAL/PLATELET - Abnormal; Notable for the following components:   Platelets 415 (*)    Neutro Abs 1.4 (*)    All other components within normal limits  BRAIN NATRIURETIC PEPTIDE  TROPONIN I (HIGH SENSITIVITY)  TROPONIN I (HIGH SENSITIVITY)    EKG EKG Interpretation  Date/Time:  Thursday July 18 2022 10:30:13 EDT Ventricular Rate:  75 PR Interval:  173 QRS Duration: 79 QT Interval:  415 QTC Calculation: 464 R Axis:   35 Text Interpretation: Sinus rhythm no acute ST/T changes no significant change since April 2024 Confirmed by Pricilla Loveless [phone]) on 07/18/2022 10:47:29 AM  Radiology DG Chest 2 View  Result Date: 07/18/2022 CLINICAL DATA:  Chest pain EXAM: CHEST - 2 VIEW COMPARISON:  X-ray 05/18/2022 FINDINGS: No consolidation, pneumothorax or effusion. Normal cardiopericardial silhouette without edema. Overlapping cardiac leads. Mild degenerative changes along the spine. IMPRESSION: No acute cardiopulmonary disease. Electronically Signed   By: Karen Kays M.D.   On: 07/18/2022 11:03    Procedures Procedures    Medications Ordered in ED Medications  acetaminophen (TYLENOL) tablet 1,000 mg (has no administration in time range)  sodium chloride 0.9 % bolus 500 mL (0 mLs Intravenous Stopped 07/18/22 1228)  ondansetron (ZOFRAN) injection 4 mg (4 mg Intravenous Given 07/18/22 1102)  ketorolac (TORADOL) 15 MG/ML injection  15 mg (15 mg Intravenous Given 07/18/22 1244)  ondansetron (ZOFRAN) injection 4 mg (4 mg Intravenous Given 07/18/22 1243)    ED Course/ Medical Decision Making/ A&P                             Medical Decision Making Amount and/or Complexity of Data Reviewed Labs: ordered.    Details: Normal troponins x 2. Radiology: ordered and independent interpretation performed.    Details: No CHF ECG/medicine tests: ordered and independent interpretation performed.    Details: No ischemia  Risk OTC drugs. Prescription drug management.   Low suspicion for ACS, PE, dissection.  She has had  multiple previous workups wrist including a dissection study within the last couple months.  She has had multiple ED workups.  I do not think she needs admission and does have outpatient cardiology follow-up within the next month or so.  She is also complaining of an acute on chronic headache without any neuro symptoms.  Low suspicion for acute CNS pathology.  Was given Toradol and later Tylenol for headache.  No acute signs of CHF on my exam.  I think she is stable for outpatient workup/management and follow-up with cardiology.  Will give return precautions.        Final Clinical Impression(s) / ED Diagnoses Final diagnoses:  Nonspecific chest pain    Rx / DC Orders ED Discharge Orders     None         Pricilla Loveless, MD 07/18/22 [phone]

## 2022-08-09 ENCOUNTER — Encounter: Payer: Self-pay | Admitting: Obstetrics

## 2022-08-09 ENCOUNTER — Encounter: Payer: Self-pay | Admitting: Family Medicine

## 2022-11-14 ENCOUNTER — Other Ambulatory Visit: Payer: Self-pay

## 2022-11-14 ENCOUNTER — Emergency Department: Payer: Medicaid Other

## 2022-11-14 ENCOUNTER — Emergency Department
Admission: EM | Admit: 2022-11-14 | Discharge: 2022-11-14 | Disposition: A | Discharge status: Home / Self Care | Payer: Medicaid Other | Attending: Emergency Medicine | Admitting: Emergency Medicine

## 2022-11-14 DIAGNOSIS — R079 Chest pain, unspecified: Secondary | ICD-10-CM

## 2022-11-14 DIAGNOSIS — R0789 Other chest pain: Secondary | ICD-10-CM | POA: Insufficient documentation

## 2022-11-14 LAB — BASIC METABOLIC PANEL
Anion gap: 7 (ref 5–15)
BUN: 12 mg/dL (ref 6–20)
CO2: 28 mmol/L (ref 22–32)
Calcium: 8.8 mg/dL — ABNORMAL LOW (ref 8.9–10.3)
Chloride: 103 mmol/L (ref 98–111)
Creatinine, Ser: 0.85 mg/dL (ref 0.44–1.00)
GFR, Estimated: >60 mL/min (ref >60)
Glucose, Bld: 106 mg/dL — ABNORMAL HIGH (ref 70–99)
Potassium: 3.5 mmol/L (ref 3.5–5.1)
Sodium: 138 mmol/L (ref 135–145)

## 2022-11-14 LAB — CBC
HCT: 39.1 % (ref 36.0–46.0)
Hemoglobin: 12.9 g/dL (ref 12.0–15.0)
MCH: 27.1 pg (ref 26.0–34.0)
MCHC: 33 g/dL (ref 30.0–36.0)
MCV: 82.1 fL (ref 80.0–100.0)
Platelets: 410 10*3/uL — ABNORMAL HIGH (ref 150–400)
RBC: 4.76 MIL/uL (ref 3.87–5.11)
RDW: 12.2 % (ref 11.5–15.5)
WBC: 5.4 10*3/uL (ref 4.0–10.5)
nRBC: 0 % (ref 0.0–0.2)

## 2022-11-14 LAB — TROPONIN I (HIGH SENSITIVITY): Troponin I (High Sensitivity): 3 ng/L (ref <18)

## 2022-11-14 MED ORDER — ALUM & MAG HYDROXIDE-SIMETH 200-200-20 MG/5ML PO SUSP
30.0000 mL | Freq: Once | ORAL | Status: AC
  Administered 2022-11-14: 30 mL via ORAL
  Filled 2022-11-14: qty 30

## 2022-11-14 MED ORDER — LIDOCAINE VISCOUS HCL 2 % MT SOLN
15.0000 mL | Freq: Once | OROMUCOSAL | Status: AC
  Administered 2022-11-14: 15 mL via ORAL
  Filled 2022-11-14: qty 15

## 2022-11-14 NOTE — ED Provider Notes (Signed)
[facility] Provider Note    Event Date/Time   First MD Initiated Contact with Patient 11/14/22 1535     (approximate)   History   Chest Pain   HPI  Gina Mendez is a 52 y.o. female  who presents to the emergency department today because of concern for chest pain. The patient states that she has been having issues with chest pain for months. It will come and go. She has not noticed any pattern to when it comes on, no specific inciting factors. It is described as a squeezing type discomfort located in her central chest. She denies any shortness of breath. Has been tried on antacids which have not seemed to have helped significantly.        Physical Exam   Triage Vital Signs: ED Triage Vitals  Encounter Vitals Group     BP 11/14/22 1352 (!) 160/93     Systolic BP Percentile --      Diastolic BP Percentile --      Pulse Rate 11/14/22 1352 85     Resp 11/14/22 1352 18     Temp 11/14/22 1352 98.3 F (36.8 C)     Temp Source 11/14/22 1352 Oral     SpO2 11/14/22 1352 99 %     Weight 11/14/22 1351 207 lb (93.9 kg)     Height 11/14/22 1351 5\' 6"  (1.676 m)     Head Circumference --      Peak Flow --      Pain Score 11/14/22 1351 3     Pain Loc --      Pain Education --      Exclude from Growth Chart --     Most recent vital signs: Vitals:   11/14/22 1352  BP: (!) 160/93  Pulse: 85  Resp: 18  Temp: 98.3 F (36.8 C)  SpO2: 99%   General: Awake, alert, oriented. CV:  Good peripheral perfusion. Regular rate and rhythm. Resp:  Normal effort. Lungs clear. Abd:  No distention.     ED Results / Procedures / Treatments   Labs (all labs ordered are listed, but only abnormal results are displayed) Labs Reviewed  BASIC METABOLIC PANEL - Abnormal; Notable for the following components:      Result Value   Glucose, Bld 106 (*)    Calcium 8.8 (*)    All other components within normal limits  CBC - Abnormal; Notable for the following  components:   Platelets 410 (*)    All other components within normal limits  TROPONIN I (HIGH SENSITIVITY)  TROPONIN I (HIGH SENSITIVITY)     EKG  I, Phineas Semen, attending physician, personally viewed and interpreted this EKG  EKG Time: 1352 Rate: 81 Rhythm: normal sinus rhythm Axis: normal Intervals: qtc 434 QRS: narrow ST changes: no st elevation Impression: normal ekg  RADIOLOGY I independently interpreted and visualized the CXR. My interpretation: No pneumonia Radiology interpretation:  IMPRESSION:  No active cardiopulmonary disease.     PROCEDURES:  Critical Care performed: No    MEDICATIONS ORDERED IN ED: Medications - No data to display   IMPRESSION / MDM / ASSESSMENT AND PLAN / ED COURSE  I reviewed the triage vital signs and the nursing notes.                              Differential diagnosis includes, but is not limited to, ACS, GERD, costochondritis  Patient's presentation  is most consistent with acute presentation with potential threat to life or bodily function.   The patient is on the cardiac monitor to evaluate for evidence of arrhythmia and/or significant heart rate changes.  Patient presented to the emergency department today because of concerns for chest pain.  Pain has been on and off for the past few months.  The patient states she has an appointment with cardiology in 4 days.  On exam patient's heart lungs with normal auscultation.  EKG and chest x-ray without concerning abnormalities.  Troponin was negative. Will try GI cocktail to see if that helps with the patient's discomfort.  Patient did feel better after GI cocktail. At this time I think it is reasonable for patient to be discharged. Patient has close follow up with cardiology already scheduled.     FINAL CLINICAL IMPRESSION(S) / ED DIAGNOSES   Final diagnoses:  Nonspecific chest pain       Note:  This document was prepared using Dragon voice recognition software  and may include unintentional dictation errors.    Phineas Semen, MD 11/14/22 [phone]

## 2022-11-14 NOTE — ED Triage Notes (Signed)
Pt sts that she is having a headache and chest pain when she breathes.

## 2022-11-14 NOTE — Discharge Instructions (Signed)
Please seek medical attention for any high fevers, chest pain, shortness of breath, change in behavior, persistent vomiting, bloody stool or any other new or concerning symptoms.  

## 2022-11-16 ENCOUNTER — Emergency Department (HOSPITAL_COMMUNITY)
Admission: EM | Admit: 2022-11-16 | Discharge: 2022-11-16 | Disposition: A | Discharge status: Home / Self Care | Payer: Medicaid Other | Attending: Emergency Medicine | Admitting: Emergency Medicine

## 2022-11-16 ENCOUNTER — Other Ambulatory Visit: Payer: Self-pay

## 2022-11-16 ENCOUNTER — Encounter (HOSPITAL_COMMUNITY): Payer: Self-pay

## 2022-11-16 DIAGNOSIS — Z79899 Other long term (current) drug therapy: Secondary | ICD-10-CM | POA: Diagnosis not present

## 2022-11-16 DIAGNOSIS — J45909 Unspecified asthma, uncomplicated: Secondary | ICD-10-CM | POA: Insufficient documentation

## 2022-11-16 DIAGNOSIS — I10 Essential (primary) hypertension: Secondary | ICD-10-CM | POA: Diagnosis not present

## 2022-11-16 DIAGNOSIS — R519 Headache, unspecified: Secondary | ICD-10-CM | POA: Diagnosis present

## 2022-11-16 MED ORDER — ACETAMINOPHEN 500 MG PO TABS
1000.0000 mg | ORAL_TABLET | Freq: Once | ORAL | Status: AC
  Administered 2022-11-16: 1000 mg via ORAL
  Filled 2022-11-16: qty 2

## 2022-11-16 MED ORDER — HALOPERIDOL LACTATE 5 MG/ML IJ SOLN
5.0000 mg | Freq: Once | INTRAMUSCULAR | Status: AC
  Administered 2022-11-16: 5 mg via INTRAVENOUS
  Filled 2022-11-16: qty 1

## 2022-11-16 MED ORDER — PROCHLORPERAZINE EDISYLATE 10 MG/2ML IJ SOLN
10.0000 mg | Freq: Once | INTRAMUSCULAR | Status: AC
  Administered 2022-11-16: 10 mg via INTRAVENOUS
  Filled 2022-11-16: qty 2

## 2022-11-16 MED ORDER — DIPHENHYDRAMINE HCL 50 MG/ML IJ SOLN
12.5000 mg | Freq: Once | INTRAMUSCULAR | Status: AC
  Administered 2022-11-16: 12.5 mg via INTRAVENOUS
  Filled 2022-11-16: qty 1

## 2022-11-16 MED ORDER — MAGNESIUM SULFATE IN D5W 1-5 GM/100ML-% IV SOLN
1.0000 g | Freq: Once | INTRAVENOUS | Status: AC
  Administered 2022-11-16: 1 g via INTRAVENOUS
  Filled 2022-11-16: qty 100

## 2022-11-16 MED ORDER — KETOROLAC TROMETHAMINE 15 MG/ML IJ SOLN
15.0000 mg | Freq: Once | INTRAMUSCULAR | Status: AC
  Administered 2022-11-16: 15 mg via INTRAVENOUS
  Filled 2022-11-16: qty 1

## 2022-11-16 NOTE — ED Triage Notes (Signed)
Pt to ED by EMS from home c/o a headache which began over a year ago and has been getting progressively worse. Pt was seen at [facility] yesterday for the same. Pt endorses photosensitivity, nausea and vomiting. Arrives A+O, VSS.

## 2022-11-16 NOTE — ED Provider Notes (Signed)
[place] EMERGENCY DEPARTMENT AT [facility] Provider Note  CSN: [id number] Arrival date & time: 11/16/22 4098  Chief Complaint(s) Headache  HPI Gina Mendez is a 52 y.o. female     Headache Pain location:  Occipital and frontal Quality:  Stabbing Onset quality:  Gradual Timing:  Intermittent Progression:  Waxing and waning Chronicity:  Recurrent Relieved by:  Nothing Worsened by:  Light Associated symptoms: nausea, photophobia and vomiting   Associated symptoms: no congestion, no cough, no eye pain, no facial pain, no fever, no focal weakness, no neck pain, no neck stiffness and no numbness     Past Medical History Past Medical History:  Diagnosis Date   Allergies    Per patient   Asthma    Per patient   Hypertension    Patient Active Problem List   Diagnosis Date Noted   Elevated sedimentation rate 01/12/2020   Pain in right ankle and joints of right foot 01/12/2020   Polyarthralgia 01/12/2020   Rash and other nonspecific skin eruption 01/12/2020   Home Medication(s) Prior to Admission medications   Medication Sig Start Date End Date Taking? Authorizing Provider  albuterol (VENTOLIN HFA) 108 (90 Base) MCG/ACT inhaler Inhale 1-2 puffs into the lungs every 6 (six) hours as needed for wheezing or shortness of breath.    [provider]  cephALEXin (KEFLEX) 500 MG capsule Take 1 capsule (500 mg total) by mouth 4 (four) times daily. 05/15/22   Arby Barrette, MD  cetirizine (ZYRTEC) 10 MG tablet Take 10 mg by mouth daily. 09/06/19   [provider]  furosemide (LASIX) 20 MG tablet Take 1 tablet (20 mg total) by mouth as needed (weight gain of 3 lbs overnight or 5 lbs in 1 week). 09/30/19   Little Ishikawa, MD  ibuprofen (ADVIL) 800 MG tablet Take 800 mg by mouth 3 (three) times daily as needed. 08/27/19   [provider]  methylPREDNISolone (MEDROL DOSEPAK) 4 MG TBPK tablet Take per package instructions 05/18/22   Roemhildt,  Lorin T, PA-C  Olopatadine HCl 0.2 % SOLN Apply 1 drop to eye every morning. 06/02/19   [provider]  omeprazole (PRILOSEC) 20 MG capsule Take 20 mg by mouth daily. 01/11/20   [provider]  omeprazole (PRILOSEC) 20 MG capsule Take 1 capsule (20 mg total) by mouth daily. 05/15/22   Arby Barrette, MD  pantoprazole (PROTONIX) 40 MG tablet Take 1 tablet (40 mg total) by mouth daily. 09/16/21   Palumbo, April, MD  phenazopyridine (PYRIDIUM) 200 MG tablet Take 1 tablet (200 mg total) by mouth 3 (three) times daily. 03/09/22   Radford Pax, NP  topiramate (TOPAMAX) 50 MG tablet Take 50 mg by mouth as needed. 11/02/19   [provider]  Allergies Almond oil, Oxycodone-acetaminophen, Iodine, Azithromycin, Codeine, Flagyl [metronidazole], and Promethazine  Review of Systems Review of Systems  Constitutional:  Negative for fever.  HENT:  Negative for congestion.   Eyes:  Positive for photophobia. Negative for pain.  Respiratory:  Negative for cough.   Gastrointestinal:  Positive for nausea and vomiting.  Musculoskeletal:  Negative for neck pain and neck stiffness.  Neurological:  Positive for headaches. Negative for focal weakness and numbness.   As noted in HPI  Physical Exam Vital Signs  I have reviewed the triage vital signs BP 119/78   Pulse 71   Temp (!) 97.5 F (36.4 C) (Oral)   Resp 18   SpO2 100%   Physical Exam Vitals reviewed.  Constitutional:      General: She is not in acute distress.    Appearance: She is well-developed. She is not diaphoretic.  HENT:     Head: Normocephalic and atraumatic.     Right Ear: External ear normal.     Left Ear: External ear normal.     Nose: Nose normal.  Eyes:     General: No scleral icterus.    Conjunctiva/sclera: Conjunctivae normal.  Neck:     Trachea: Phonation normal.   Cardiovascular:     Rate and Rhythm: Normal rate and regular rhythm.  Pulmonary:     Effort: Pulmonary effort is normal. No respiratory distress.     Breath sounds: No stridor.  Abdominal:     General: There is no distension.  Musculoskeletal:        General: Normal range of motion.     Cervical back: Normal range of motion.  Neurological:     Mental Status: She is alert and oriented to person, place, and time.     Cranial Nerves: Cranial nerves 2-12 are intact.     Sensory: Sensation is intact.     Motor: Motor function is intact.     Coordination: Coordination is intact.  Psychiatric:        Behavior: Behavior normal.     ED Results and Treatments Labs (all labs ordered are listed, but only abnormal results are displayed) Labs Reviewed - No data to display                                                                                                                       EKG  EKG Interpretation Date/Time:    Ventricular Rate:    PR Interval:    QRS Duration:    QT Interval:    QTC Calculation:   R Axis:      Text Interpretation:         Radiology No results found.  Medications Ordered in ED Medications  ketorolac (TORADOL) 15 MG/ML injection 15 mg (15 mg Intravenous Given 11/16/22 0346)  prochlorperazine (COMPAZINE) injection 10 mg (10 mg Intravenous Given 11/16/22 0346)  diphenhydrAMINE (BENADRYL) injection 12.5 mg (12.5 mg Intravenous Given 11/16/22 0347)  acetaminophen (TYLENOL) tablet 1,000 mg (1,000 mg Oral Given  11/16/22 0347)  magnesium sulfate IVPB 1 g 100 mL (0 g Intravenous Stopped 11/16/22 0557)  haloperidol lactate (HALDOL) injection 5 mg (5 mg Intravenous Given 11/16/22 4098)   Procedures Procedures  (including critical care time) Medical Decision Making / ED Course   Medical Decision Making Risk OTC drugs. Prescription drug management.     Clinical Course as of 11/16/22 1191  Sat Nov 16, 2022  4782 Typical headache for the  patient. Non focal neuro exam.  No fever. Doubt meningitis.  Doubt IIH. No recent head trauma. Doubt intracranial bleed.  No indication for imaging.  Will treat with migraine cocktail and reevaluate.   [PC]  0431 No improvement. Will give Mag [PC]  0600 Still with pain. Haldol given. [PC]  [phone] Would like to go home. Will provide info for neurology  [PC]    Clinical Course User Index [PC] Kirrah Mustin, Amadeo Garnet, MD    Final Clinical Impression(s) / ED Diagnoses Final diagnoses:  Bad headache   The patient appears reasonably screened and/or stabilized for discharge and I doubt any other medical condition or other [facility] requiring further screening, evaluation, or treatment in the ED at this time. I have discussed the findings, Dx and Tx plan with the patient/family who expressed understanding and agree(s) with the plan. Discharge instructions discussed at length. The patient/family was given strict return precautions who verbalized understanding of the instructions. No further questions at time of discharge.  Disposition: Discharge  Condition: Good  ED Discharge Orders     None       Follow Up: Center, [facility] [address] [place] [address] [phone]  Call  to schedule an appointment for close follow up  [facility] NEUROLOGY [address] [place], [address] [place] [address] [phone] Call  to schedule an appointment for close follow up    This chart was dictated using voice recognition software.  Despite best efforts to proofread,  errors can occur which can change the documentation meaning.    Nira Conn, MD 11/16/22 [phone]

## 2022-11-16 NOTE — Plan of Care (Signed)
[facility] Tonsillectomy/Adenoidectomy, Postoperative PEDS care plan entered in error.

## 2022-11-19 ENCOUNTER — Other Ambulatory Visit: Payer: Self-pay | Admitting: Cardiology

## 2022-11-19 ENCOUNTER — Other Ambulatory Visit: Payer: Self-pay | Admitting: Family Medicine

## 2022-11-19 DIAGNOSIS — R079 Chest pain, unspecified: Secondary | ICD-10-CM

## 2022-11-19 DIAGNOSIS — M5412 Radiculopathy, cervical region: Secondary | ICD-10-CM

## 2022-11-19 DIAGNOSIS — R0609 Other forms of dyspnea: Secondary | ICD-10-CM

## 2022-11-19 DIAGNOSIS — M5416 Radiculopathy, lumbar region: Secondary | ICD-10-CM

## 2022-11-26 ENCOUNTER — Telehealth (HOSPITAL_COMMUNITY): Payer: Self-pay | Admitting: *Deleted

## 2022-11-26 NOTE — Telephone Encounter (Signed)
Reaching out to patient to offer assistance regarding upcoming cardiac imaging study; patient reports currently wearing heart monitor and request to reschedule after this has been completed.    Patient r/s for 11/11 per request.   Johney Frame RN Navigator Cardiac Imaging [facility] Heart [facility] V[facility] [phone] office [phone] cell

## 2022-11-27 ENCOUNTER — Ambulatory Visit: Payer: Medicaid Other

## 2022-12-04 ENCOUNTER — Encounter (HOSPITAL_COMMUNITY): Payer: Self-pay

## 2022-12-05 ENCOUNTER — Ambulatory Visit
Admission: RE | Admit: 2022-12-05 | Discharge: 2022-12-05 | Disposition: A | Discharge status: Home / Self Care | Payer: Medicaid Other | Source: Ambulatory Visit | Attending: Family Medicine | Admitting: Family Medicine

## 2022-12-05 DIAGNOSIS — M5412 Radiculopathy, cervical region: Secondary | ICD-10-CM

## 2022-12-05 DIAGNOSIS — M5416 Radiculopathy, lumbar region: Secondary | ICD-10-CM

## 2022-12-06 ENCOUNTER — Telehealth (HOSPITAL_COMMUNITY): Payer: Self-pay | Admitting: Emergency Medicine

## 2022-12-06 NOTE — Telephone Encounter (Signed)
Reaching out to patient to offer assistance regarding upcoming cardiac imaging study; pt verbalizes understanding of appt date/time, parking situation and where to check in, pre-test NPO status and medications ordered, and verified current allergies; name and call back number provided for further questions should they arise [name] RN Navigator Cardiac Imaging [name] [phone] office [phone] cell  Correction from mychart message 7:00p, 1:00a, and 7:00a for prednisone and benadryl doses leading up to test

## 2022-12-09 ENCOUNTER — Ambulatory Visit
Admission: RE | Admit: 2022-12-09 | Discharge: 2022-12-09 | Disposition: A | Discharge status: Home / Self Care | Payer: Medicaid Other | Source: Ambulatory Visit | Attending: Cardiology | Admitting: Cardiology

## 2022-12-09 DIAGNOSIS — R0609 Other forms of dyspnea: Secondary | ICD-10-CM | POA: Diagnosis present

## 2022-12-09 DIAGNOSIS — R079 Chest pain, unspecified: Secondary | ICD-10-CM | POA: Diagnosis present

## 2022-12-09 MED ORDER — NITROGLYCERIN 0.4 MG SL SUBL
SUBLINGUAL_TABLET | SUBLINGUAL | Status: AC
  Filled 2022-12-09: qty 1

## 2022-12-09 MED ORDER — NITROGLYCERIN 0.4 MG SL SUBL
0.8000 mg | SUBLINGUAL_TABLET | Freq: Once | SUBLINGUAL | Status: AC
  Administered 2022-12-09: 0.8 mg via SUBLINGUAL
  Filled 2022-12-09: qty 25

## 2022-12-09 MED ORDER — METOPROLOL TARTRATE 5 MG/5ML IV SOLN: 10.0000 mg | Freq: Once | INTRAVENOUS | Status: DC

## 2022-12-09 MED ORDER — IOHEXOL 350 MG/ML SOLN
80.0000 mL | Freq: Once | INTRAVENOUS | Status: AC | PRN
  Administered 2022-12-09: 80 mL via INTRAVENOUS

## 2022-12-09 NOTE — Progress Notes (Signed)
Patient tolerated procedure well. Ambulate w/o difficulty. Denies any lightheadedness or being dizzy. Pt denies any pain at this time. Sitting in chair. Pt is encouraged to drink additional water throughout the day and reason explained to patient. Patient verbalized understanding and all questions answered. ABC intact. No further needs at this time. Discharge from procedure area w/o issues. 

## 2022-12-19 ENCOUNTER — Other Ambulatory Visit: Payer: Self-pay | Admitting: Neurology

## 2022-12-19 DIAGNOSIS — Z87820 Personal history of traumatic brain injury: Secondary | ICD-10-CM

## 2022-12-19 DIAGNOSIS — R519 Headache, unspecified: Secondary | ICD-10-CM

## 2022-12-30 ENCOUNTER — Other Ambulatory Visit: Payer: Self-pay | Admitting: Family Medicine

## 2022-12-30 DIAGNOSIS — M5412 Radiculopathy, cervical region: Secondary | ICD-10-CM

## 2023-01-09 ENCOUNTER — Encounter: Payer: Self-pay | Admitting: Neurology

## 2023-01-14 ENCOUNTER — Encounter: Payer: Self-pay | Admitting: Physical Medicine & Rehabilitation

## 2023-01-15 ENCOUNTER — Other Ambulatory Visit: Payer: Medicaid Other

## 2023-01-16 NOTE — Discharge Instructions (Signed)

## 2023-01-17 ENCOUNTER — Inpatient Hospital Stay
Admission: RE | Admit: 2023-01-17 | Discharge: 2023-01-17 | Disposition: A | Discharge status: Home / Self Care | Payer: Medicaid Other | Source: Ambulatory Visit | Attending: Family Medicine | Admitting: Family Medicine

## 2023-02-18 ENCOUNTER — Institutional Professional Consult (permissible substitution) (HOSPITAL_BASED_OUTPATIENT_CLINIC_OR_DEPARTMENT_OTHER): Payer: Medicaid Other | Admitting: Pulmonary Disease

## 2023-02-21 ENCOUNTER — Other Ambulatory Visit: Payer: Self-pay | Admitting: Physician Assistant

## 2023-02-21 DIAGNOSIS — H53149 Visual discomfort, unspecified: Secondary | ICD-10-CM

## 2023-02-21 DIAGNOSIS — Z820 Family history of epilepsy and other diseases of the nervous system: Secondary | ICD-10-CM

## 2023-02-21 DIAGNOSIS — Z87820 Personal history of traumatic brain injury: Secondary | ICD-10-CM

## 2023-02-21 DIAGNOSIS — R519 Headache, unspecified: Secondary | ICD-10-CM

## 2023-02-24 ENCOUNTER — Encounter (HOSPITAL_BASED_OUTPATIENT_CLINIC_OR_DEPARTMENT_OTHER): Payer: Self-pay | Admitting: Pulmonary Disease

## 2023-03-05 ENCOUNTER — Encounter: Payer: Self-pay | Admitting: Physician Assistant

## 2023-03-10 ENCOUNTER — Encounter: Payer: Self-pay | Admitting: Physician Assistant

## 2023-03-12 ENCOUNTER — Inpatient Hospital Stay: Admission: RE | Admit: 2023-03-12 | Payer: Medicaid Other | Source: Ambulatory Visit

## 2023-04-01 ENCOUNTER — Other Ambulatory Visit: Payer: Medicaid Other

## 2023-04-22 ENCOUNTER — Ambulatory Visit
Admission: RE | Admit: 2023-04-22 | Discharge: 2023-04-22 | Disposition: A | Discharge status: Home / Self Care | Source: Ambulatory Visit | Attending: Physician Assistant | Admitting: Physician Assistant

## 2023-04-22 DIAGNOSIS — R519 Headache, unspecified: Secondary | ICD-10-CM

## 2023-04-22 DIAGNOSIS — Z87820 Personal history of traumatic brain injury: Secondary | ICD-10-CM

## 2023-04-22 DIAGNOSIS — Z820 Family history of epilepsy and other diseases of the nervous system: Secondary | ICD-10-CM

## 2023-04-22 DIAGNOSIS — H53149 Visual discomfort, unspecified: Secondary | ICD-10-CM

## 2023-04-22 MED ORDER — GADOPICLENOL 0.5 MMOL/ML IV SOLN
10.0000 mL | Freq: Once | INTRAVENOUS | Status: AC | PRN
  Administered 2023-04-22: 10 mL via INTRAVENOUS

## 2023-05-22 ENCOUNTER — Encounter: Payer: Self-pay | Admitting: *Deleted

## 2023-05-22 ENCOUNTER — Emergency Department

## 2023-05-22 ENCOUNTER — Emergency Department
Admission: EM | Admit: 2023-05-22 | Discharge: 2023-05-22 | Disposition: A | Discharge status: Home / Self Care | Attending: Emergency Medicine | Admitting: Emergency Medicine

## 2023-05-22 ENCOUNTER — Other Ambulatory Visit: Payer: Self-pay

## 2023-05-22 DIAGNOSIS — I10 Essential (primary) hypertension: Secondary | ICD-10-CM | POA: Diagnosis not present

## 2023-05-22 DIAGNOSIS — J45909 Unspecified asthma, uncomplicated: Secondary | ICD-10-CM | POA: Insufficient documentation

## 2023-05-22 DIAGNOSIS — R0602 Shortness of breath: Secondary | ICD-10-CM | POA: Diagnosis not present

## 2023-05-22 DIAGNOSIS — R002 Palpitations: Secondary | ICD-10-CM | POA: Insufficient documentation

## 2023-05-22 DIAGNOSIS — R0789 Other chest pain: Secondary | ICD-10-CM | POA: Insufficient documentation

## 2023-05-22 LAB — BASIC METABOLIC PANEL WITH GFR
Anion gap: 9 (ref 5–15)
BUN: 8 mg/dL (ref 6–20)
CO2: 23 mmol/L (ref 22–32)
Calcium: 8 mg/dL — ABNORMAL LOW (ref 8.9–10.3)
Chloride: 105 mmol/L (ref 98–111)
Creatinine, Ser: 0.73 mg/dL (ref 0.44–1.00)
GFR, Estimated: >60 mL/min (ref >60)
Glucose, Bld: 80 mg/dL (ref 70–99)
Potassium: 3.4 mmol/L — ABNORMAL LOW (ref 3.5–5.1)
Sodium: 137 mmol/L (ref 135–145)

## 2023-05-22 LAB — CBC
HCT: 34.9 % — ABNORMAL LOW (ref 36.0–46.0)
Hemoglobin: 11.4 g/dL — ABNORMAL LOW (ref 12.0–15.0)
MCH: 27.1 pg (ref 26.0–34.0)
MCHC: 32.7 g/dL (ref 30.0–36.0)
MCV: 83.1 fL (ref 80.0–100.0)
Platelets: 361 10*3/uL (ref 150–400)
RBC: 4.2 MIL/uL (ref 3.87–5.11)
RDW: 12.7 % (ref 11.5–15.5)
WBC: 5 10*3/uL (ref 4.0–10.5)
nRBC: 0 % (ref 0.0–0.2)

## 2023-05-22 LAB — TROPONIN I (HIGH SENSITIVITY)
Troponin I (High Sensitivity): <2 ng/L (ref <18)
Troponin I (High Sensitivity): 2 ng/L (ref <18)

## 2023-05-22 LAB — D-DIMER, QUANTITATIVE: D-Dimer, Quant: <0.27 ug{FEU}/mL (ref 0.00–0.50)

## 2023-05-22 LAB — BRAIN NATRIURETIC PEPTIDE: B Natriuretic Peptide: 20.5 pg/mL (ref 0.0–100.0)

## 2023-05-22 NOTE — ED Triage Notes (Signed)
 Brought from [facility]. C/o rapid heart beat that started about 3:45pm. Also c/o chest pain that radiates to back with sob.  Reports 2lb weight gain since yesterday.   [facility] vitals: 129/74 b/p 98HR 99 O2 98.2 oral

## 2023-05-22 NOTE — Discharge Instructions (Addendum)
 Please follow-up with your cardiology team as needed.  Your workup was reassuring today with no concerns for heart attack or signs of blood clots.  No evidence of pneumonia on your chest x-ray.

## 2023-05-22 NOTE — ED Notes (Signed)
 Patient visualized walking through front door, back into lobby at this time

## 2023-05-22 NOTE — ED Triage Notes (Addendum)
 Pt is here for evaluation of palpitations.  Pt has had this in the past and today it began at 3:45, she states that it feels like a fluttering. Pt is reporting left sided chest pain with radiation to back back with sob and the sob increases with activity/exertion

## 2023-05-22 NOTE — ED Notes (Signed)
 Patient called again with no answer. Not visualized in lobby

## 2023-05-22 NOTE — ED Provider Notes (Signed)
 [facility] Provider Note    Event Date/Time   First MD Initiated Contact with Patient 05/22/23 1751     (approximate)   History   Palpitations   HPI Gina Mendez is a 53 y.o. female with history of HTN, asthma presenting today for palpitations.  Patient states yesterday she had an episode of some left-sided chest pain.  Felt sharp in nature with some associated shortness of breath.  It has occurred intermittently since then.  Mild symptoms present at this time.  Denies any sharp pain when she takes a deep breath then.  Otherwise denies any nausea, vomiting, diaphoresis, leg pain.  No prior to her blood clots.  Is followed frequently by cardiology outpatient including a visit yesterday all symptoms were ongoing.     Physical Exam   Triage Vital Signs: ED Triage Vitals  Encounter Vitals Group     BP 05/22/23 1626 134/76     Systolic BP Percentile --      Diastolic BP Percentile --      Pulse Rate 05/22/23 1626 92     Resp 05/22/23 1626 16     Temp 05/22/23 1626 98.4 F (36.9 C)     Temp Source 05/22/23 1626 Oral     SpO2 05/22/23 1626 100 %     Weight 05/22/23 1616 218 lb 3.2 oz (99 kg)     Height --      Head Circumference --      Peak Flow --      Pain Score 05/22/23 1623 4     Pain Loc --      Pain Education --      Exclude from Growth Chart --     Most recent vital signs: Vitals:   05/22/23 1626  BP: 134/76  Pulse: 92  Resp: 16  Temp: 98.4 F (36.9 C)  SpO2: 100%   Physical Exam: I have reviewed the vital signs and nursing notes. General: Awake, alert, no acute distress.  Nontoxic appearing. Head:  Atraumatic, normocephalic.   ENT:  EOM intact, PERRL. Oral mucosa is pink and moist with no lesions. Neck: Neck is supple with full range of motion, No meningeal signs. Cardiovascular:  RRR, No murmurs. Peripheral pulses palpable and equal bilaterally. Respiratory:  Symmetrical chest wall expansion.  No rhonchi, rales, or wheezes.   Good air movement throughout.  No use of accessory muscles.   Musculoskeletal:  No cyanosis or edema. Moving extremities with full ROM Abdomen:  Soft, nontender, nondistended. Neuro:  GCS 15, moving all four extremities, interacting appropriately. Speech clear. Psych:  Calm, appropriate.   Skin:  Warm, dry, no rash.    ED Results / Procedures / Treatments   Labs (all labs ordered are listed, but only abnormal results are displayed) Labs Reviewed  BASIC METABOLIC PANEL WITH GFR - Abnormal; Notable for the following components:      Result Value   Potassium 3.4 (*)    Calcium 8.0 (*)    All other components within normal limits  CBC - Abnormal; Notable for the following components:   Hemoglobin 11.4 (*)    HCT 34.9 (*)    All other components within normal limits  D-DIMER, QUANTITATIVE  BRAIN NATRIURETIC PEPTIDE  TROPONIN I (HIGH SENSITIVITY)  TROPONIN I (HIGH SENSITIVITY)     EKG My EKG interpretation: Rate of 92, normal sinus rhythm, normal axis, normal intervals.  No acute ST elevations or depressions   RADIOLOGY Independently interpreted chest x-ray with no  acute pathology   PROCEDURES:  Critical Care performed: No  Procedures   MEDICATIONS ORDERED IN ED: Medications - No data to display   IMPRESSION / MDM / ASSESSMENT AND PLAN / ED COURSE  I reviewed the triage vital signs and the nursing notes.                              Differential diagnosis includes, but is not limited to, ACS, pneumonia, pneumothorax, cardiac arrhythmia, electrolyte abnormality, lower suspicion for PE  Patient's presentation is most consistent with acute complicated illness / injury requiring diagnostic workup.  Patient is a 53 year old female with history of chronic intermittent palpitations and chest pain presenting today for the same.  Currently her exam is rather unremarkable and vital signs completely stable.  EKG with no ischemic findings.  CBC and BMP largely unremarkable.   Chest x-ray with no acute findings.  Troponin negative x 2.  D-dimer negative.  BNP normal limits.  Patient has a history of chronic palpitations and chest pain and seen extensively by cardiology.  She was reassessed with no ongoing symptoms and I do feel she is safe for discharge at this time with no acute pathology seen here today.  She is agreeable with this plan and given strict return precautions.  The patient is on the cardiac monitor to evaluate for evidence of arrhythmia and/or significant heart rate changes.     FINAL CLINICAL IMPRESSION(S) / ED DIAGNOSES   Final diagnoses:  Palpitations     Rx / DC Orders   ED Discharge Orders     None        Note:  This document was prepared using Dragon voice recognition software and may include unintentional dictation errors.   Kandee Orion, MD 05/22/23 [id number]

## 2023-05-22 NOTE — ED Notes (Signed)
 Pt approached me a little while ago and told me that she had to run over to her work (she does security at [employer]) as she was concerned about losing her job.  Pt has not returned at this time.  Called her on her cell, no answer

## 2023-06-02 ENCOUNTER — Ambulatory Visit
Admission: RE | Admit: 2023-06-02 | Discharge: 2023-06-02 | Disposition: A | Discharge status: Home / Self Care | Attending: Internal Medicine | Admitting: Internal Medicine

## 2023-06-02 ENCOUNTER — Encounter
Admission: RE | Disposition: A | Discharge status: Home / Self Care | Payer: Self-pay | Source: Home / Self Care | Attending: Internal Medicine

## 2023-06-02 ENCOUNTER — Encounter: Payer: Self-pay | Admitting: Internal Medicine

## 2023-06-02 ENCOUNTER — Other Ambulatory Visit: Payer: Self-pay

## 2023-06-02 DIAGNOSIS — I509 Heart failure, unspecified: Secondary | ICD-10-CM | POA: Insufficient documentation

## 2023-06-02 DIAGNOSIS — I272 Pulmonary hypertension, unspecified: Secondary | ICD-10-CM | POA: Diagnosis not present

## 2023-06-02 HISTORY — PX: RIGHT HEART CATH: CATH118263

## 2023-06-02 LAB — POCT I-STAT EG7
Acid-base deficit: 3 mmol/L — ABNORMAL HIGH (ref 0.0–2.0)
Bicarbonate: 22.6 mmol/L (ref 20.0–28.0)
Calcium, Ion: 1.24 mmol/L (ref 1.15–1.40)
HCT: 33 % — ABNORMAL LOW (ref 36.0–46.0)
Hemoglobin: 11.2 g/dL — ABNORMAL LOW (ref 12.0–15.0)
O2 Saturation: 71 %
Potassium: 3.7 mmol/L (ref 3.5–5.1)
Sodium: 139 mmol/L (ref 135–145)
TCO2: 24 mmol/L (ref 22–32)
pCO2, Ven: 40.6 mmHg — ABNORMAL LOW (ref 44–60)
pH, Ven: 7.354 (ref 7.25–7.43)
pO2, Ven: 39 mmHg (ref 32–45)

## 2023-06-02 SURGERY — RIGHT HEART CATH
Anesthesia: Moderate Sedation | Laterality: Right

## 2023-06-02 MED ORDER — LIDOCAINE HCL (PF) 1 % IJ SOLN
INTRAMUSCULAR | Status: DC | PRN
  Administered 2023-06-02: 2 mL

## 2023-06-02 MED ORDER — HEPARIN (PORCINE) IN NACL 1000-0.9 UT/500ML-% IV SOLN
INTRAVENOUS | Status: DC | PRN
  Administered 2023-06-02: 1000 mL

## 2023-06-02 MED ORDER — SODIUM CHLORIDE 0.9% FLUSH: 3.0000 mL | Freq: Two times a day (BID) | INTRAVENOUS | Status: DC

## 2023-06-02 MED ORDER — SODIUM CHLORIDE 0.9 % WEIGHT BASED INFUSION: 1.0000 mL/kg/h | INTRAVENOUS | Status: DC

## 2023-06-02 MED ORDER — VERAPAMIL HCL 2.5 MG/ML IV SOLN
INTRAVENOUS | Status: AC
  Filled 2023-06-02: qty 2

## 2023-06-02 MED ORDER — FENTANYL CITRATE (PF) 100 MCG/2ML IJ SOLN
INTRAMUSCULAR | Status: AC
  Filled 2023-06-02: qty 2

## 2023-06-02 MED ORDER — SODIUM CHLORIDE 0.9 % WEIGHT BASED INFUSION: 3.0000 mL/kg/h | INTRAVENOUS | Status: DC

## 2023-06-02 MED ORDER — FENTANYL CITRATE (PF) 100 MCG/2ML IJ SOLN
INTRAMUSCULAR | Status: DC | PRN
  Administered 2023-06-02: 12.5 ug via INTRAVENOUS

## 2023-06-02 MED ORDER — MIDAZOLAM HCL 2 MG/2ML IJ SOLN
INTRAMUSCULAR | Status: DC | PRN
  Administered 2023-06-02: .5 mg via INTRAVENOUS

## 2023-06-02 MED ORDER — SODIUM CHLORIDE 0.9 % IV SOLN: 250.0000 mL | INTRAVENOUS | Status: DC | PRN

## 2023-06-02 MED ORDER — SODIUM CHLORIDE 0.9 % WEIGHT BASED INFUSION
75.0000 mL/h | INTRAVENOUS | Status: AC
  Administered 2023-06-02: 75 mL/h via INTRAVENOUS

## 2023-06-02 MED ORDER — LIDOCAINE HCL 1 % IJ SOLN
INTRAMUSCULAR | Status: AC
  Filled 2023-06-02: qty 20

## 2023-06-02 MED ORDER — MIDAZOLAM HCL 2 MG/2ML IJ SOLN
INTRAMUSCULAR | Status: AC
  Filled 2023-06-02: qty 2

## 2023-06-02 MED ORDER — SODIUM CHLORIDE 0.9% FLUSH: 3.0000 mL | INTRAVENOUS | Status: DC | PRN

## 2023-06-02 SURGICAL SUPPLY — 6 items
CATH BALLN WEDGE 5F 110CM (CATHETERS) IMPLANT
DRAPE BRACHIAL (DRAPES) IMPLANT
PACK CARDIAC CATH (CUSTOM PROCEDURE TRAY) ×1 IMPLANT
SET ATX-X65L (MISCELLANEOUS) IMPLANT
SHEATH GLIDE SLENDER 4/5FR (SHEATH) IMPLANT
STATION PROTECTION PRESSURIZED (MISCELLANEOUS) IMPLANT

## 2023-06-03 ENCOUNTER — Encounter: Payer: Self-pay | Admitting: Internal Medicine

## 2023-07-06 ENCOUNTER — Emergency Department (HOSPITAL_BASED_OUTPATIENT_CLINIC_OR_DEPARTMENT_OTHER)

## 2023-07-06 ENCOUNTER — Other Ambulatory Visit: Payer: Self-pay

## 2023-07-06 ENCOUNTER — Emergency Department (HOSPITAL_BASED_OUTPATIENT_CLINIC_OR_DEPARTMENT_OTHER)
Admission: EM | Admit: 2023-07-06 | Discharge: 2023-07-06 | Disposition: A | Discharge status: Home / Self Care | Attending: Emergency Medicine | Admitting: Emergency Medicine

## 2023-07-06 DIAGNOSIS — R0602 Shortness of breath: Secondary | ICD-10-CM | POA: Diagnosis present

## 2023-07-06 DIAGNOSIS — J4541 Moderate persistent asthma with (acute) exacerbation: Secondary | ICD-10-CM

## 2023-07-06 LAB — CBC WITH DIFFERENTIAL/PLATELET
Abs Immature Granulocytes: 0 10*3/uL (ref 0.00–0.07)
Basophils Absolute: 0 10*3/uL (ref 0.0–0.1)
Basophils Relative: 1 %
Eosinophils Absolute: 0.1 10*3/uL (ref 0.0–0.5)
Eosinophils Relative: 2 %
HCT: 36.7 % (ref 36.0–46.0)
Hemoglobin: 12.2 g/dL (ref 12.0–15.0)
Immature Granulocytes: 0 %
Lymphocytes Relative: 62 %
Lymphs Abs: 3.1 10*3/uL (ref 0.7–4.0)
MCH: 26 pg (ref 26.0–34.0)
MCHC: 33.2 g/dL (ref 30.0–36.0)
MCV: 78.1 fL — ABNORMAL LOW (ref 80.0–100.0)
Monocytes Absolute: 0.5 10*3/uL (ref 0.1–1.0)
Monocytes Relative: 10 %
Neutro Abs: 1.2 10*3/uL — ABNORMAL LOW (ref 1.7–7.7)
Neutrophils Relative %: 25 %
Platelets: 417 10*3/uL — ABNORMAL HIGH (ref 150–400)
RBC: 4.7 MIL/uL (ref 3.87–5.11)
RDW: 13.2 % (ref 11.5–15.5)
WBC: 4.9 10*3/uL (ref 4.0–10.5)
nRBC: 0 % (ref 0.0–0.2)

## 2023-07-06 LAB — PRO BRAIN NATRIURETIC PEPTIDE: Pro Brain Natriuretic Peptide: 88.8 pg/mL (ref <300.0)

## 2023-07-06 LAB — BASIC METABOLIC PANEL WITH GFR
Anion gap: 16 — ABNORMAL HIGH (ref 5–15)
BUN: 7 mg/dL (ref 6–20)
CO2: 20 mmol/L — ABNORMAL LOW (ref 22–32)
Calcium: 9.9 mg/dL (ref 8.9–10.3)
Chloride: 101 mmol/L (ref 98–111)
Creatinine, Ser: 0.79 mg/dL (ref 0.44–1.00)
GFR, Estimated: >60 mL/min (ref >60)
Glucose, Bld: 89 mg/dL (ref 70–99)
Potassium: 3.6 mmol/L (ref 3.5–5.1)
Sodium: 136 mmol/L (ref 135–145)

## 2023-07-06 LAB — TROPONIN T, HIGH SENSITIVITY
Troponin T High Sensitivity: <15 ng/L (ref <19)
Troponin T High Sensitivity: <15 ng/L (ref <19)

## 2023-07-06 LAB — D-DIMER, QUANTITATIVE: D-Dimer, Quant: 0.37 ug{FEU}/mL (ref 0.00–0.50)

## 2023-07-06 MED ORDER — IPRATROPIUM-ALBUTEROL 0.5-2.5 (3) MG/3ML IN SOLN
RESPIRATORY_TRACT | Status: AC
  Administered 2023-07-06: 3 mL
  Filled 2023-07-06: qty 3

## 2023-07-06 MED ORDER — ALBUTEROL SULFATE (2.5 MG/3ML) 0.083% IN NEBU
INHALATION_SOLUTION | RESPIRATORY_TRACT | Status: AC
  Filled 2023-07-06: qty 3

## 2023-07-06 MED ORDER — IPRATROPIUM-ALBUTEROL 0.5-2.5 (3) MG/3ML IN SOLN
3.0000 mL | Freq: Once | RESPIRATORY_TRACT | Status: AC
  Administered 2023-07-06: 3 mL via RESPIRATORY_TRACT

## 2023-07-06 MED ORDER — PREDNISONE 20 MG PO TABS: ORAL_TABLET | ORAL | 0 refills | Status: DC

## 2023-07-06 MED ORDER — ALUM & MAG HYDROXIDE-SIMETH 200-200-20 MG/5ML PO SUSP
30.0000 mL | Freq: Once | ORAL | Status: AC
  Administered 2023-07-06: 30 mL via ORAL
  Filled 2023-07-06: qty 30

## 2023-07-06 MED ORDER — MAGNESIUM SULFATE 2 GM/50ML IV SOLN
2.0000 g | Freq: Once | INTRAVENOUS | Status: AC
  Administered 2023-07-06: 2 g via INTRAVENOUS
  Filled 2023-07-06: qty 50

## 2023-07-06 MED ORDER — ALBUTEROL (5 MG/ML) CONTINUOUS INHALATION SOLN
10.0000 mg/h | INHALATION_SOLUTION | Freq: Once | RESPIRATORY_TRACT | Status: AC
  Administered 2023-07-06: 10 mg/h via RESPIRATORY_TRACT
  Filled 2023-07-06: qty 20

## 2023-07-06 MED ORDER — METHYLPREDNISOLONE SODIUM SUCC 125 MG IJ SOLR
125.0000 mg | Freq: Once | INTRAMUSCULAR | Status: AC
  Administered 2023-07-06: 125 mg via INTRAVENOUS
  Filled 2023-07-06: qty 2

## 2023-07-06 MED ORDER — ALBUTEROL SULFATE (2.5 MG/3ML) 0.083% IN NEBU: 2.5000 mg | INHALATION_SOLUTION | Freq: Once | RESPIRATORY_TRACT | Status: DC

## 2023-07-06 MED ORDER — SODIUM CHLORIDE 0.9 % IV BOLUS
1000.0000 mL | Freq: Once | INTRAVENOUS | Status: AC
  Administered 2023-07-06: 1000 mL via INTRAVENOUS

## 2023-07-06 NOTE — ED Provider Notes (Signed)
 [place] EMERGENCY DEPARTMENT AT [facility] Provider Note   CSN: [id number] Arrival date & time: 07/06/23  1654     History  No chief complaint on file.   Gina Mendez is a 53 y.o. female.  Patient is a 53 year old female who presents with wheezing and shortness of breath.  She has a history of asthma.  Her boyfriend states that her asthma has been worse over the last month or so but today it was triggered by breathing some cleaning solutions.  She used her inhaler at home but has not had any improvement in symptoms.  History is limited by her severe shortness of breath.  Once patient's breathing improved, she states that she has had some difficulty controlling her asthma recently.  She is followed by pulmonologist.  She is on medications for asthma.  She says it has not been well-controlled in a while even on medications.  It started getting worse last night.  She has had some associated leg swelling but that comes and goes chronically.  She has had cardiac evaluations and it was felt that her symptoms are more related to her asthma.  She denies any chest pain.  No productive cough or fevers.       Home Medications Prior to Admission medications   Medication Sig Start Date End Date Taking? Authorizing Provider  predniSONE  (DELTASONE ) 20 MG tablet 2 tabs po daily x 4 days 07/06/23  Yes Hershel Los, MD  albuterol (VENTOLIN HFA) 108 (90 Base) MCG/ACT inhaler Inhale 1-2 puffs into the lungs every 6 (six) hours as needed for wheezing or shortness of breath.    [provider]  carvedilol (COREG) 6.25 MG tablet Take 6.25 mg by mouth 2 (two) times daily as needed (palpitations). 05/21/23 05/20/24  [provider]  loratadine (CLARITIN) 10 MG tablet Take 10 mg by mouth daily.    [provider]  losartan (COZAAR) 50 MG tablet Take 50 mg by mouth daily. 02/09/23   [provider]      Allergies    Almond oil, Percocet  [oxycodone-acetaminophen ], Iodine, Codeine, Flagyl [metronidazole], Phenergan [promethazine], Topamax [topiramate], Zithromax [azithromycin], and Neurontin [gabapentin]    Review of Systems   Review of Systems  Unable to perform ROS: Acuity of condition    Physical Exam Updated Vital Signs BP (!) 164/90   Pulse (!) 114   Temp 97.9 F (36.6 C) (Oral)   Resp 17   SpO2 96%  Physical Exam Constitutional:      General: She is in acute distress.     Appearance: She is well-developed. She is ill-appearing and diaphoretic.  HENT:     Head: Normocephalic and atraumatic.  Eyes:     Pupils: Pupils are equal, round, and reactive to light.  Cardiovascular:     Rate and Rhythm: Normal rate and regular rhythm.     Heart sounds: Normal heart sounds.  Pulmonary:     Effort: Pulmonary effort is normal. No respiratory distress.     Breath sounds: No wheezing or rales.     Comments: Markedly diminished breath sounds in all lung fields, no wheezing Chest:     Chest wall: No tenderness.  Abdominal:     General: Bowel sounds are normal.     Palpations: Abdomen is soft.     Tenderness: There is no abdominal tenderness. There is no guarding or rebound.  Musculoskeletal:        General: Normal range of motion.     Cervical back:  Normal range of motion and neck supple.     Comments: 1+ edema to lower extremities bilaterally  Lymphadenopathy:     Cervical: No cervical adenopathy.  Skin:    General: Skin is warm.     Findings: No rash.  Neurological:     Mental Status: She is alert and oriented to person, place, and time.     ED Results / Procedures / Treatments   Labs (all labs ordered are listed, but only abnormal results are displayed) Labs Reviewed  BASIC METABOLIC PANEL WITH GFR - Abnormal; Notable for the following components:      Result Value   CO2 20 (*)    Anion gap 16 (*)    All other components within normal limits  CBC WITH DIFFERENTIAL/PLATELET - Abnormal; Notable for the  following components:   MCV 78.1 (*)    Platelets 417 (*)    Neutro Abs 1.2 (*)    All other components within normal limits  PRO BRAIN NATRIURETIC PEPTIDE  D-DIMER, QUANTITATIVE  TROPONIN T, HIGH SENSITIVITY  TROPONIN T, HIGH SENSITIVITY    EKG EKG Interpretation Date/Time:  Sunday July 06 2023 17:52:38 EDT Ventricular Rate:  91 PR Interval:  154 QRS Duration:  84 QT Interval:  395 QTC Calculation: 486 R Axis:   33  Text Interpretation: Sinus rhythm Borderline repolarization abnormality Borderline prolonged QT interval since last tracing no significant change Confirmed by Hershel Los [phone]) on 07/06/2023 6:35:18 PM  Radiology DG Chest Port 1 View Result Date: 07/06/2023 CLINICAL DATA:  Shortness of breath EXAM: PORTABLE CHEST 1 VIEW COMPARISON:  05/22/2023 FINDINGS: Stable cardiomediastinal silhouette. Aortic atherosclerotic calcification. No focal consolidation, pleural effusion, or pneumothorax. No displaced rib fractures. IMPRESSION: No acute cardiopulmonary disease. Electronically Signed   By: Rozell Cornet M.D.   On: 07/06/2023 17:59    Procedures Procedures    Medications Ordered in ED Medications  albuterol (PROVENTIL) (2.5 MG/3ML) 0.083% nebulizer solution 2.5 mg (2.5 mg Nebulization Not Given 07/06/23 1726)  albuterol (PROVENTIL) (2.5 MG/3ML) 0.083% nebulizer solution (  Not Given 07/06/23 1713)  ipratropium-albuterol (DUONEB) 0.5-2.5 (3) MG/3ML nebulizer solution 3 mL (3 mLs Nebulization Given 07/06/23 1713)  ipratropium-albuterol (DUONEB) 0.5-2.5 (3) MG/3ML nebulizer solution (3 mLs  Given 07/06/23 1713)  methylPREDNISolone  sodium succinate (SOLU-MEDROL ) 125 mg/2 mL injection 125 mg (125 mg Intravenous Given 07/06/23 1740)  magnesium  sulfate IVPB 2 g 50 mL (0 g Intravenous Stopped 07/06/23 1740)  albuterol (PROVENTIL,VENTOLIN) solution continuous neb (10 mg/hr Nebulization Given 07/06/23 1727)  alum & mag hydroxide-simeth (MAALOX/MYLANTA) 200-200-20 MG/5ML suspension 30 mL  (30 mLs Oral Given 07/06/23 2101)  sodium chloride  0.9 % bolus 1,000 mL (1,000 mLs Intravenous New Bag/Given 07/06/23 2110)    ED Course/ Medical Decision Making/ A&P                                 Medical Decision Making Amount and/or Complexity of Data Reviewed Labs: ordered. Radiology: ordered.  Risk OTC drugs. Prescription drug management.   Patient is a 53 year old who presents with wheezing and shortness of breath.  She was markedly short of breath with diminished breath sounds on arrival.  She was not able to talk.  She was immediately started on nebulizer treatments.  She was given IV steroids.  She had fairly rapid improvement after this.  She had a chest x-ray which does not show any evidence of pneumonia.  This was interpreted by me and  confirmed by the radiologist.  No evidence of pulmonary edema or CHF.  Her EKG does not show any ischemic changes.  She has had 2 negative troponins.  No other concerns for ACS.  Her D-dimer is normal.  No other concerns for pulmonary embolus.  She was monitored for several hours and has continued to do well.  She is not requiring oxygen.  She was initially tachycardic but this has improved with IV fluids.  Her last heart rate was 98.  She feels much better and is ready to go home.  She was discharged home in good condition.  She has an appointment this week with her pulmonologist.  She was given a prednisone  burst.  Return precautions were given.  CRITICAL CARE Performed by: Hershel Los Total critical care time: 70 minutes Critical care time was exclusive of separately billable procedures and treating other patients. Critical care was necessary to treat or prevent imminent or life-threatening deterioration. Critical care was time spent personally by me on the following activities: development of treatment plan with patient and/or surrogate as well as nursing, discussions with consultants, evaluation of patient's response to treatment, examination  of patient, obtaining history from patient or surrogate, ordering and performing treatments and interventions, ordering and review of laboratory studies, ordering and review of radiographic studies, pulse oximetry and re-evaluation of patient's condition.   Final Clinical Impression(s) / ED Diagnoses Final diagnoses:  Moderate persistent asthma with exacerbation    Rx / DC Orders ED Discharge Orders          Ordered    predniSONE  (DELTASONE ) 20 MG tablet        07/06/23 2250              Hershel Los, MD 07/06/23 2300

## 2023-07-06 NOTE — ED Notes (Signed)
 Pt has a refused the EKG, pt states " I don't need an EKG, its not my heart its my lungs, I need someone to check my lungs." RN has been notified about the refusal of the EKG.

## 2023-07-06 NOTE — Discharge Instructions (Addendum)
 Follow up with your pulmonologist as discussed.  Return to the emergency department for any worsening symptoms.

## 2023-07-06 NOTE — ED Triage Notes (Signed)
 Short of breath ,asthma exacerbated. Took inhaler at home, not helping.

## 2023-07-06 NOTE — ED Notes (Signed)
 Rt Note: patient started on a cont. Neb treatment. Currently she is tolerating well. She feels she is breathing a little easier and can now speak full sentences. SPO2 100%, HR 90, RR22

## 2023-07-06 NOTE — ED Notes (Signed)
 Pt discharged home after verbalizing understanding of discharge instructions; nad noted.

## 2023-07-06 NOTE — ED Notes (Signed)
 Whereas, upon admission to our unit pt. Looked panicked and air-hungry, now she is relaxed and is breathing normally. She thanks us  for our care and states "I feel a lot better".

## 2023-07-06 NOTE — ED Notes (Signed)
 Unable to continue triage due to pt condition.

## 2023-07-07 ENCOUNTER — Ambulatory Visit
Admission: EM | Admit: 2023-07-07 | Discharge: 2023-07-07 | Disposition: A | Discharge status: Home / Self Care | Attending: Family Medicine | Admitting: Family Medicine

## 2023-07-07 ENCOUNTER — Other Ambulatory Visit: Payer: Self-pay

## 2023-07-07 DIAGNOSIS — A084 Viral intestinal infection, unspecified: Secondary | ICD-10-CM

## 2023-07-07 DIAGNOSIS — R112 Nausea with vomiting, unspecified: Secondary | ICD-10-CM | POA: Diagnosis not present

## 2023-07-07 DIAGNOSIS — M545 Low back pain, unspecified: Secondary | ICD-10-CM | POA: Diagnosis not present

## 2023-07-07 LAB — POCT URINALYSIS DIP (MANUAL ENTRY)
Bilirubin, UA: NEGATIVE
Glucose, UA: NEGATIVE mg/dL
Ketones, POC UA: NEGATIVE mg/dL
Leukocytes, UA: NEGATIVE
Nitrite, UA: NEGATIVE
Protein Ur, POC: 30 mg/dL — AB
Spec Grav, UA: 1.025 (ref 1.010–1.025)
Urobilinogen, UA: 1 U/dL
pH, UA: 6.5 (ref 5.0–8.0)

## 2023-07-07 LAB — POC COVID19/FLU A&B COMBO
Covid Antigen, POC: NEGATIVE
Influenza A Antigen, POC: NEGATIVE
Influenza B Antigen, POC: NEGATIVE

## 2023-07-07 MED ORDER — ONDANSETRON 4 MG PO TBDP
4.0000 mg | ORAL_TABLET | Freq: Once | ORAL | Status: AC
  Administered 2023-07-07: 4 mg via ORAL

## 2023-07-07 MED ORDER — ONDANSETRON 4 MG PO TBDP: 4.0000 mg | ORAL_TABLET | Freq: Three times a day (TID) | ORAL | 0 refills | Status: AC | PRN

## 2023-07-07 MED ORDER — KETOROLAC TROMETHAMINE 30 MG/ML IJ SOLN
30.0000 mg | Freq: Once | INTRAMUSCULAR | Status: AC
  Administered 2023-07-07: 30 mg via INTRAMUSCULAR

## 2023-07-07 NOTE — Discharge Instructions (Addendum)
 You were given a Toradol  injection in clinic today. Do not take any over the counter NSAID's such as Advil, ibuprofen, Aleve, or naproxen for 24 hours. You may take tylenol  if needed.  You tested negative for COVID and flu.  Urine shows no sign of infection.  You may take Zofran  every 8 hours as needed for nausea or vomiting.  You were given a dose while in the clinic.  Focus on rest, hydration/electrolyte replacement with Gatorade, Powerade, Pedialyte, water.  Please follow-up with your PCP in 2 to 3 days for recheck.  Please go to the ER if you develop any worsening symptoms.  I hope you feel better soon!
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
## 2023-07-07 NOTE — ED Triage Notes (Addendum)
 Pt c/o N/V started at 0300 today. Pt c/o pain in legs and arms bilat. Pt states vomited once today

## 2023-09-30 ENCOUNTER — Emergency Department
Admission: EM | Admit: 2023-09-30 | Discharge: 2023-09-30 | Disposition: A | Discharge status: Home / Self Care | Attending: Emergency Medicine | Admitting: Emergency Medicine

## 2023-09-30 ENCOUNTER — Emergency Department

## 2023-09-30 ENCOUNTER — Other Ambulatory Visit: Payer: Self-pay

## 2023-09-30 DIAGNOSIS — I10 Essential (primary) hypertension: Secondary | ICD-10-CM | POA: Insufficient documentation

## 2023-09-30 DIAGNOSIS — R519 Headache, unspecified: Secondary | ICD-10-CM | POA: Insufficient documentation

## 2023-09-30 DIAGNOSIS — J45909 Unspecified asthma, uncomplicated: Secondary | ICD-10-CM | POA: Insufficient documentation

## 2023-09-30 LAB — COMPREHENSIVE METABOLIC PANEL WITH GFR
ALT: 18 U/L (ref 0–44)
AST: 24 U/L (ref 15–41)
Albumin: 3.3 g/dL — ABNORMAL LOW (ref 3.5–5.0)
Alkaline Phosphatase: 77 U/L (ref 38–126)
Anion gap: 10 (ref 5–15)
BUN: 7 mg/dL (ref 6–20)
CO2: 26 mmol/L (ref 22–32)
Calcium: 9.4 mg/dL (ref 8.9–10.3)
Chloride: 103 mmol/L (ref 98–111)
Creatinine, Ser: 0.72 mg/dL (ref 0.44–1.00)
GFR, Estimated: >60 mL/min (ref >60)
Glucose, Bld: 106 mg/dL — ABNORMAL HIGH (ref 70–99)
Potassium: 3.6 mmol/L (ref 3.5–5.1)
Sodium: 139 mmol/L (ref 135–145)
Total Bilirubin: 0.7 mg/dL (ref 0.0–1.2)
Total Protein: 6.5 g/dL (ref 6.5–8.1)

## 2023-09-30 LAB — CBC WITH DIFFERENTIAL/PLATELET
Abs Immature Granulocytes: 0 K/uL (ref 0.00–0.07)
Basophils Absolute: 0 K/uL (ref 0.0–0.1)
Basophils Relative: 1 %
Eosinophils Absolute: 0.1 K/uL (ref 0.0–0.5)
Eosinophils Relative: 3 %
HCT: 37.4 % (ref 36.0–46.0)
Hemoglobin: 12.4 g/dL (ref 12.0–15.0)
Immature Granulocytes: 0 %
Lymphocytes Relative: 59 %
Lymphs Abs: 2.5 K/uL (ref 0.7–4.0)
MCH: 26.5 pg (ref 26.0–34.0)
MCHC: 33.2 g/dL (ref 30.0–36.0)
MCV: 79.9 fL — ABNORMAL LOW (ref 80.0–100.0)
Monocytes Absolute: 0.4 K/uL (ref 0.1–1.0)
Monocytes Relative: 9 %
Neutro Abs: 1.2 K/uL — ABNORMAL LOW (ref 1.7–7.7)
Neutrophils Relative %: 28 %
Platelets: 388 K/uL (ref 150–400)
RBC: 4.68 MIL/uL (ref 3.87–5.11)
RDW: 13.8 % (ref 11.5–15.5)
WBC: 4.3 K/uL (ref 4.0–10.5)
nRBC: 0 % (ref 0.0–0.2)

## 2023-09-30 MED ORDER — KETOROLAC TROMETHAMINE 30 MG/ML IJ SOLN
30.0000 mg | Freq: Once | INTRAMUSCULAR | Status: AC
  Administered 2023-09-30: 30 mg via INTRAMUSCULAR
  Filled 2023-09-30: qty 1

## 2023-09-30 NOTE — ED Provider Notes (Signed)
 [facility] Provider Note    Event Date/Time   First MD Initiated Contact with Patient 09/30/23 1650     (approximate)   History   Headache   HPI  Gina Mendez is a 53 y.o. female who presents to the ED for evaluation of Headache   I review a PCP visit from 1 month ago.  Complaining of neck, head, back and shoulder pain after a chiropractic adjustment. Otherwise history of obesity, asthma, HTN, GERD and cervical radiculitis.  Patient presents for evaluation of acute on chronic occipital headaches.  She reports months-years of these symptoms but has seemed worse in the past 1 week.  Often improved with naproxen at home.  She has an upcoming neurology evaluation on Thursday as an outpatient.  I also reviewed an MRI brain that was obtained as an outpatient 6 months ago from PCP due to these headaches without evidence of acute pathology.   Physical Exam   Triage Vital Signs: ED Triage Vitals  Encounter Vitals Group     BP 09/30/23 1438 (!) 162/82     Girls Systolic BP Percentile --      Girls Diastolic BP Percentile --      Boys Systolic BP Percentile --      Boys Diastolic BP Percentile --      Pulse Rate 09/30/23 1438 77     Resp 09/30/23 1438 17     Temp 09/30/23 1438 98.7 F (37.1 C)     Temp src --      SpO2 09/30/23 1438 96 %     Weight 09/30/23 1435 210 lb (95.3 kg)     Height 09/30/23 1435 5' 6 (1.676 m)     Head Circumference --      Peak Flow --      Pain Score 09/30/23 1435 7     Pain Loc --      Pain Education --      Exclude from Growth Chart --     Most recent vital signs: Vitals:   09/30/23 1438 09/30/23 1730  BP: (!) 162/82 (!) 151/98  Pulse: 77 61  Resp: 17 18  Temp: 98.7 F (37.1 C)   SpO2: 96% 100%    General: Awake, no distress.  CV:  Good peripheral perfusion.  Resp:  Normal effort.  Abd:  No distention.  MSK:  No deformity noted.  Neuro:  No focal deficits appreciated. Cranial nerves II through XII  intact 5/5 strength and sensation in all 4 extremities Other:     ED Results / Procedures / Treatments   Labs (all labs ordered are listed, but only abnormal results are displayed) Labs Reviewed  CBC WITH DIFFERENTIAL/PLATELET - Abnormal; Notable for the following components:      Result Value   MCV 79.9 (*)    Neutro Abs 1.2 (*)    All other components within normal limits  COMPREHENSIVE METABOLIC PANEL WITH GFR - Abnormal; Notable for the following components:   Glucose, Bld 106 (*)    Albumin 3.3 (*)    All other components within normal limits    EKG   RADIOLOGY CT head interpreted by me without evidence of acute intracranial pathology  Official radiology report(s): CT Head Wo Contrast Result Date: 09/30/2023 CLINICAL DATA:  Headache. EXAM: CT HEAD WITHOUT CONTRAST TECHNIQUE: Contiguous axial images were obtained from the base of the skull through the vertex without intravenous contrast. RADIATION DOSE REDUCTION: This exam was performed according to the departmental  dose-optimization program which includes automated exposure control, adjustment of the mA and/or kV according to patient size and/or use of iterative reconstruction technique. COMPARISON:  Brain MRI 07/23/2023 FINDINGS: Brain: No intracranial hemorrhage, mass effect, or midline shift. No hydrocephalus. The basilar cisterns are patent. No evidence of territorial infarct or acute ischemia. No extra-axial or intracranial fluid collection. Vascular: No hyperdense vessel or unexpected calcification. Skull: No fracture or focal lesion. Sinuses/Orbits: Paranasal sinuses and mastoid air cells are clear. The visualized orbits are unremarkable. Other: None. IMPRESSION: No acute intracranial abnormality or explanation for headache. Electronically Signed   By: Andrea Gasman M.D.   On: 09/30/2023 16:17    PROCEDURES and INTERVENTIONS:  Procedures  Medications  ketorolac  (TORADOL ) 30 MG/ML injection 30 mg (30 mg Intramuscular  Given 09/30/23 1747)     IMPRESSION / MDM / ASSESSMENT AND PLAN / ED COURSE  I reviewed the triage vital signs and the nursing notes.  Differential diagnosis includes, but is not limited to, migraine headaches, ICH, tension headaches,  {Patient presents with symptoms of an acute illness or injury that is potentially life-threatening.  Patient presents with acute on chronic headaches without neurologic deficits or acutely concerning features, suitable for outpatient management.  Reassuring CT head and basic labs with normal CBC and metabolic panel.  Normal exam.  Reports a waning headache after taking medications earlier this morning but she wanted to get checked out as she was concerned about fluid around her brain.  We discussed likely etiologies of her headaches and upcoming neurology evaluation as an outpatient which is reassuring.  Discussed appropriate ED return precautions with patient and suitable for outpatient management.      FINAL CLINICAL IMPRESSION(S) / ED DIAGNOSES   Final diagnoses:  Bad headache     Rx / DC Orders   ED Discharge Orders     None        Note:  This document was prepared using Dragon voice recognition software and may include unintentional dictation errors.   Claudene Rover, MD 09/30/23 [phone]

## 2023-09-30 NOTE — Discharge Instructions (Signed)
 Use Tylenol  for pain and fevers.  Up to 1000 mg per dose, up to 4 times per day.  Do not take more than 4000 mg of Tylenol /acetaminophen  within 24 hours..  Use naproxen /Aleve  for anti-inflammatory pain relief. Use up to 500mg  every 12 hours. Do not take more frequently than this. Do not use other NSAIDs (ibuprofen, Advil) while taking this medication. It is safe to take Tylenol  with this.
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
## 2023-10-02 ENCOUNTER — Other Ambulatory Visit: Payer: Self-pay | Admitting: Family Medicine

## 2023-10-02 ENCOUNTER — Encounter: Payer: Self-pay | Admitting: Family Medicine

## 2023-10-02 DIAGNOSIS — M5412 Radiculopathy, cervical region: Secondary | ICD-10-CM

## 2023-10-23 ENCOUNTER — Other Ambulatory Visit

## 2023-11-01 ENCOUNTER — Emergency Department

## 2023-11-01 ENCOUNTER — Other Ambulatory Visit: Payer: Self-pay

## 2023-11-01 ENCOUNTER — Emergency Department
Admission: EM | Admit: 2023-11-01 | Discharge: 2023-11-01 | Disposition: A | Discharge status: Home / Self Care | Source: Ambulatory Visit

## 2023-11-01 DIAGNOSIS — J45909 Unspecified asthma, uncomplicated: Secondary | ICD-10-CM | POA: Insufficient documentation

## 2023-11-01 DIAGNOSIS — D72819 Decreased white blood cell count, unspecified: Secondary | ICD-10-CM | POA: Diagnosis not present

## 2023-11-01 DIAGNOSIS — R197 Diarrhea, unspecified: Secondary | ICD-10-CM

## 2023-11-01 DIAGNOSIS — I10 Essential (primary) hypertension: Secondary | ICD-10-CM | POA: Insufficient documentation

## 2023-11-01 DIAGNOSIS — K529 Noninfective gastroenteritis and colitis, unspecified: Secondary | ICD-10-CM | POA: Insufficient documentation

## 2023-11-01 DIAGNOSIS — R112 Nausea with vomiting, unspecified: Secondary | ICD-10-CM | POA: Diagnosis present

## 2023-11-01 LAB — COMPREHENSIVE METABOLIC PANEL WITH GFR
ALT: 15 U/L (ref 0–44)
AST: 24 U/L (ref 15–41)
Albumin: 3 g/dL — ABNORMAL LOW (ref 3.5–5.0)
Alkaline Phosphatase: 61 U/L (ref 38–126)
Anion gap: 9 (ref 5–15)
BUN: 9 mg/dL (ref 6–20)
CO2: 24 mmol/L (ref 22–32)
Calcium: 8.5 mg/dL — ABNORMAL LOW (ref 8.9–10.3)
Chloride: 104 mmol/L (ref 98–111)
Creatinine, Ser: 0.66 mg/dL (ref 0.44–1.00)
GFR, Estimated: >60 mL/min (ref >60)
Glucose, Bld: 97 mg/dL (ref 70–99)
Potassium: 3.8 mmol/L (ref 3.5–5.1)
Sodium: 137 mmol/L (ref 135–145)
Total Bilirubin: 1.2 mg/dL (ref 0.0–1.2)
Total Protein: 6.7 g/dL (ref 6.5–8.1)

## 2023-11-01 LAB — LIPASE, BLOOD: Lipase: 33 U/L (ref 11–51)

## 2023-11-01 LAB — URINALYSIS, ROUTINE W REFLEX MICROSCOPIC
Bacteria, UA: NONE SEEN
Bilirubin Urine: NEGATIVE
Glucose, UA: NEGATIVE mg/dL
Ketones, ur: NEGATIVE mg/dL
Leukocytes,Ua: NEGATIVE
Nitrite: NEGATIVE
Protein, ur: NEGATIVE mg/dL
Specific Gravity, Urine: 1.017 (ref 1.005–1.030)
pH: 7 (ref 5.0–8.0)

## 2023-11-01 LAB — CBC
HCT: 41.7 % (ref 36.0–46.0)
Hemoglobin: 13.9 g/dL (ref 12.0–15.0)
MCH: 26.6 pg (ref 26.0–34.0)
MCHC: 33.3 g/dL (ref 30.0–36.0)
MCV: 79.9 fL — ABNORMAL LOW (ref 80.0–100.0)
Platelets: 400 K/uL (ref 150–400)
RBC: 5.22 MIL/uL — ABNORMAL HIGH (ref 3.87–5.11)
RDW: 13.3 % (ref 11.5–15.5)
WBC: 3.9 K/uL — ABNORMAL LOW (ref 4.0–10.5)
nRBC: 0 % (ref 0.0–0.2)

## 2023-11-01 LAB — RESP PANEL BY RT-PCR (RSV, FLU A&B, COVID)  RVPGX2
Influenza A by PCR: NEGATIVE
Influenza B by PCR: NEGATIVE
Resp Syncytial Virus by PCR: NEGATIVE
SARS Coronavirus 2 by RT PCR: NEGATIVE

## 2023-11-01 LAB — POC URINE PREG, ED: Preg Test, Ur: NEGATIVE

## 2023-11-01 LAB — TROPONIN I (HIGH SENSITIVITY): Troponin I (High Sensitivity): 3 ng/L (ref <18)

## 2023-11-01 MED ORDER — ONDANSETRON 4 MG PO TBDP: 4.0000 mg | ORAL_TABLET | Freq: Three times a day (TID) | ORAL | 0 refills | Status: DC | PRN

## 2023-11-01 MED ORDER — ALUM & MAG HYDROXIDE-SIMETH 200-200-20 MG/5ML PO SUSP
30.0000 mL | Freq: Once | ORAL | Status: AC
  Administered 2023-11-01: 30 mL via ORAL
  Filled 2023-11-01: qty 30

## 2023-11-01 MED ORDER — LIDOCAINE VISCOUS HCL 2 % MT SOLN
15.0000 mL | Freq: Once | OROMUCOSAL | Status: AC
  Administered 2023-11-01: 15 mL via ORAL
  Filled 2023-11-01: qty 15

## 2023-11-01 MED ORDER — ACETAMINOPHEN 500 MG PO TABS: 1000.0000 mg | ORAL_TABLET | Freq: Four times a day (QID) | ORAL | 2 refills | Status: AC | PRN

## 2023-11-01 MED ORDER — IOHEXOL 300 MG/ML  SOLN
100.0000 mL | Freq: Once | INTRAMUSCULAR | Status: AC | PRN
  Administered 2023-11-01: 100 mL via INTRAVENOUS

## 2023-11-01 MED ORDER — PANTOPRAZOLE SODIUM 40 MG IV SOLR
40.0000 mg | Freq: Once | INTRAVENOUS | Status: AC
  Administered 2023-11-01: 40 mg via INTRAVENOUS
  Filled 2023-11-01: qty 10

## 2023-11-01 MED ORDER — DIPHENHYDRAMINE HCL 50 MG/ML IJ SOLN
25.0000 mg | Freq: Once | INTRAMUSCULAR | Status: AC
  Administered 2023-11-01: 25 mg via INTRAVENOUS
  Filled 2023-11-01: qty 1

## 2023-11-01 MED ORDER — OMEPRAZOLE MAGNESIUM 20 MG PO TBEC: 20.0000 mg | DELAYED_RELEASE_TABLET | Freq: Every day | ORAL | 0 refills | Status: DC

## 2023-11-01 MED ORDER — ALUMINUM-MAGNESIUM-SIMETHICONE 200-200-20 MG/5ML PO SUSP: 30.0000 mL | Freq: Three times a day (TID) | ORAL | 0 refills | Status: DC

## 2023-11-01 MED ORDER — ACETAMINOPHEN 500 MG PO TABS: 1000.0000 mg | ORAL_TABLET | Freq: Four times a day (QID) | ORAL | 2 refills | Status: DC | PRN

## 2023-11-01 MED ORDER — ONDANSETRON 4 MG PO TBDP: 4.0000 mg | ORAL_TABLET | Freq: Three times a day (TID) | ORAL | 0 refills | Status: AC | PRN

## 2023-11-01 MED ORDER — ALUMINUM-MAGNESIUM-SIMETHICONE 200-200-20 MG/5ML PO SUSP: 30.0000 mL | Freq: Three times a day (TID) | ORAL | 0 refills | Status: AC

## 2023-11-01 MED ORDER — OMEPRAZOLE MAGNESIUM 20 MG PO TBEC: 20.0000 mg | DELAYED_RELEASE_TABLET | Freq: Every day | ORAL | 0 refills | Status: AC

## 2023-11-01 MED ORDER — ONDANSETRON HCL 4 MG/2ML IJ SOLN
4.0000 mg | Freq: Once | INTRAMUSCULAR | Status: AC
  Administered 2023-11-01: 4 mg via INTRAVENOUS
  Filled 2023-11-01: qty 2

## 2023-11-01 MED ORDER — ACETAMINOPHEN 500 MG PO TABS
1000.0000 mg | ORAL_TABLET | Freq: Once | ORAL | Status: AC
  Administered 2023-11-01: 1000 mg via ORAL
  Filled 2023-11-01: qty 2

## 2023-11-01 NOTE — ED Notes (Signed)
 Lab called to add on Troponin.

## 2023-11-01 NOTE — Discharge Instructions (Addendum)
 Your evaluation in the emergency department was overall reassuring, and your symptoms improved with treatment.  I suspect you likely had a viral illness that appears to be improving that has caused irritation of the lining of your intestines.  This should improve on its own in the next 1-2 days.  I prescribed a nausea medication as well as an antacid medication to use for the next Avril days.  You can use Tylenol  as needed for any recurrent discomfort.  Please do follow-up with your primary care provider for reevaluation, and return to the emergency department with any new or worsening symptoms.

## 2023-11-01 NOTE — ED Provider Notes (Signed)
 [facility] Provider Note    Event Date/Time   First MD Initiated Contact with Patient 11/01/23 1147     (approximate)   History   Abdominal Pain  Pt to ED from [facility] for c/o n/v/d. Abdominal pain began on Wednesday after eating. Pain has been increasing each day.   HPI Gina Mendez is a 53 y.o. female PMH asthma, hypertension, past surgical history of umbilical hernia repair, Laparoscopic endometriosis fulguration presents for evaluation of abdominal pain with nausea, vomiting, diarrhea - Present for the past 3-4 days, primarily epigastric.  At times 10/10 though pain is in waves.  Unclear if it is postprandial.  Nonexertional.  No chest pain or shortness of breath. - Has vomited multiple times today (3+), nonbloody/nonbilious.  Has also been having diarrhea that is nonblack and nonbloody.  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 - No urinary symptoms - No fevers - No significant lower abdominal pain  Per chart review, last seen in our emergency department on 09/30/2023 for headache.  CT head negative.  Recent MRI brain also with no acute pathology.  Subsequently seen in clinic today noting epigastric pain after eating since 10/29/2023.  Sent to ED for eval.  Last abdominal imaging on chart review and April 2024.  CTA chest abdomen pelvis with no acute pathology.  No underlying aneurysms.  No underlying gallstones appreciated at that time.     Physical Exam   Triage Vital Signs: ED Triage Vitals  Encounter Vitals Group     BP 11/01/23 1135 (!) 152/98     Girls Systolic BP Percentile --      Girls Diastolic BP Percentile --      Boys Systolic BP Percentile --      Boys Diastolic BP Percentile --      Pulse Rate 11/01/23 1135 79     Resp 11/01/23 1135 16     Temp 11/01/23 1135 97.9 F (36.6 C)     Temp Source 11/01/23 1135 Oral     SpO2 11/01/23 1135 100 %     Weight 11/01/23 1138 207 lb (93.9 kg)     Height 11/01/23 1138 5' 6  (1.676 m)     Head Circumference --      Peak Flow --      Pain Score 11/01/23 1136 0     Pain Loc --      Pain Education --      Exclude from Growth Chart --     Most recent vital signs: Vitals:   11/01/23 1315 11/01/23 1330  BP:    Pulse: 61 62  Resp:    Temp:    SpO2: 100% 99%     General: Awake, no distress.  CV:  Good peripheral perfusion. RRR, RP 2+ Resp:  Normal effort. CTAB Abd:  No distention.  Moderate tenderness to palpation in the epigastrium, very mild tenderness in right lower quadrant Other:  No CVAT   ED Results / Procedures / Treatments   Labs (all labs ordered are listed, but only abnormal results are displayed) Labs Reviewed  COMPREHENSIVE METABOLIC PANEL WITH GFR - Abnormal; Notable for the following components:      Result Value   Calcium 8.5 (*)    Albumin 3.0 (*)    All other components within normal limits  CBC - Abnormal; Notable for the following components:   WBC 3.9 (*)    RBC 5.22 (*)    MCV 79.9 (*)  All other components within normal limits  URINALYSIS, ROUTINE W REFLEX MICROSCOPIC - Abnormal; Notable for the following components:   Color, Urine YELLOW (*)    APPearance CLEAR (*)    Hgb urine dipstick SMALL (*)    All other components within normal limits  RESP PANEL BY RT-PCR (RSV, FLU A&B, COVID)  RVPGX2  LIPASE, BLOOD  POC URINE PREG, ED  TROPONIN I (HIGH SENSITIVITY)     EKG  Ecg = sinus rhythm, rate 70, no gross ST elevation or depression, no significant repolarization abnormality, normal axis, normal intervals.  No evidence of ischemia nor arrhythmia on my interpretation.   RADIOLOGY Radiology interpreted by myself and radiology report reviewed.  Overall consistent with enteritis.  No clear acute pathology identified.    PROCEDURES:  Critical Care performed: No  Procedures   MEDICATIONS ORDERED IN ED: Medications  ondansetron  (ZOFRAN ) injection 4 mg (4 mg Intravenous Given 11/01/23 1241)  acetaminophen   (TYLENOL ) tablet 1,000 mg (1,000 mg Oral Given 11/01/23 1247)  alum & mag hydroxide-simeth (MAALOX/MYLANTA) 200-200-20 MG/5ML suspension 30 mL (30 mLs Oral Given 11/01/23 1249)    And  lidocaine  (XYLOCAINE ) 2 % viscous mouth solution 15 mL (15 mLs Oral Given 11/01/23 1249)  pantoprazole  (PROTONIX ) injection 40 mg (40 mg Intravenous Given 11/01/23 1244)  diphenhydrAMINE  (BENADRYL ) injection 25 mg (25 mg Intravenous Given 11/01/23 1252)  iohexol  (OMNIPAQUE ) 300 MG/ML solution 100 mL (100 mLs Intravenous Contrast Given 11/01/23 1355)     IMPRESSION / MDM / ASSESSMENT AND PLAN / ED COURSE  I reviewed the triage vital signs and the nursing notes.                              DDX/MDM/AP: Differential diagnosis includes, but is not limited to, gastroenteritis, consider food poisoning, viral syndrome including influenza or COVID-19, cholecystitis, pancreatitis, considered but doubt appendicitis, UTI.  Doubt anginal equivalent.  Plan: - Labs - EKG - GI cocktail, Tylenol , Zofran  - In shared decision making, patient would like to pursue CT abdomen pelvis  Patient's presentation is most consistent with acute presentation with potential threat to life or bodily function.  The patient is on the cardiac monitor to evaluate for evidence of arrhythmia and/or significant heart rate changes.  ED course below.   Clinical Course as of 11/01/23 1521  Sat Nov 01, 2023  1159 Hcg neg [MM]  1235 CBC with mild leukopenia, otherwise unremarkable  CMP reviewed, overall unremarkable [MM]  1303 Trop wnl [MM]  1509 CTAP: IMPRESSION: 1. Prominent but nondilated loops of small bowel in the left and central abdomen which demonstrate air-fluid levels and perhaps mild inflammatory stranding in the adjacent fat. A small volume of free fluid is present in the pelvis. There is no clear transition point and no overt dilation. Differential considerations are broad and include infectious and inflammatory processes such  as enteritis. 2. There is no peritoneal abscess. The appendix is within normal limits. 3. Colonic diverticulosis without evidence of diverticulitis. 4. Bilateral ovarian cysts. 5. Hiatal hernia.   [MM]  1517 Patient reevaluated, pain is entirely resolved.  No further vomiting or diarrhea.  Feeling very well and requesting discharge home.  Reassured by CT with no acute findings.  Presentation most consistent with enteritis.  Will send home with Zofran , omeprazole , Maalox and plan for PMD follow-up.  ED return precautions in place.  Patient agrees with plan. [MM]    Clinical Course User Index [MM] Clarine Sharper  A, MD     FINAL CLINICAL IMPRESSION(S) / ED DIAGNOSES   Final diagnoses:  Enteritis  Nausea vomiting and diarrhea     Rx / DC Orders   ED Discharge Orders          Ordered    ondansetron  (ZOFRAN -ODT) 4 MG disintegrating tablet  Every 8 hours PRN        11/01/23 1519    omeprazole  (PRILOSEC OTC) 20 MG tablet  Daily        11/01/23 1519    acetaminophen  (TYLENOL ) 500 MG tablet  Every 6 hours PRN        11/01/23 1519    aluminum-magnesium  hydroxide-simethicone  (MAALOX) 200-200-20 MG/5ML SUSP  3 times daily before meals & bedtime        11/01/23 1519             Note:  This document was prepared using Dragon voice recognition software and may include unintentional dictation errors.   Clarine Ozell LABOR, MD 11/01/23 [phone]

## 2023-11-01 NOTE — ED Notes (Signed)
 First nurse note: From [facility] for epigastric pain since last Thursday. Also NVD. Hx PUD, feels the same. Has tried TUMS and Aleve. VSS.

## 2023-11-01 NOTE — ED Triage Notes (Signed)
 Pt to ED from [facility] for c/o n/v/d. Abdominal pain began on Wednesday after eating. Pain has been increasing each day.

## 2023-11-05 NOTE — Discharge Instructions (Signed)

## 2023-11-06 ENCOUNTER — Inpatient Hospital Stay
Admission: RE | Admit: 2023-11-06 | Discharge: 2023-11-06 | Disposition: A | Discharge status: Home / Self Care | Source: Ambulatory Visit | Attending: Family Medicine | Admitting: Family Medicine

## 2023-12-29 ENCOUNTER — Emergency Department
Admission: EM | Admit: 2023-12-29 | Discharge: 2023-12-29 | Disposition: A | Discharge status: Home / Self Care | Payer: Self-pay | Attending: Emergency Medicine | Admitting: Emergency Medicine

## 2023-12-29 DIAGNOSIS — J45909 Unspecified asthma, uncomplicated: Secondary | ICD-10-CM | POA: Insufficient documentation

## 2023-12-29 DIAGNOSIS — M792 Neuralgia and neuritis, unspecified: Secondary | ICD-10-CM | POA: Insufficient documentation

## 2023-12-29 MED ORDER — PREDNISONE 10 MG (21) PO TBPK: ORAL_TABLET | ORAL | 0 refills | Status: AC
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
## 2023-12-29 NOTE — Discharge Instructions (Signed)
 I recommend an EMG to test for ulnar nerve compression. Call and schedule an appointment with neurology.

## 2023-12-29 NOTE — ED Notes (Signed)
Pt left before vital signs could be taken.
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
